# Patient Record
Sex: Male | Born: 1975 | Race: Black or African American | Hispanic: No | Marital: Married | Smoking: Never smoker
Health system: Southern US, Community
[De-identification: ages and names within clinical notes are randomized; demographics above are authoritative.]

## PROBLEM LIST (undated history)

## (undated) DIAGNOSIS — E119 Type 2 diabetes mellitus without complications: Secondary | ICD-10-CM

## (undated) DIAGNOSIS — G459 Transient cerebral ischemic attack, unspecified: Secondary | ICD-10-CM

## (undated) DIAGNOSIS — I10 Essential (primary) hypertension: Secondary | ICD-10-CM

## (undated) HISTORY — PX: FOOT SURGERY: SHX648

## (undated) HISTORY — PX: BRAIN SURGERY: SHX531

---

## 1999-11-01 ENCOUNTER — Emergency Department (HOSPITAL_COMMUNITY): Admission: EM | Admit: 1999-11-01 | Discharge: 1999-11-01 | Payer: Self-pay | Admitting: Emergency Medicine

## 2009-08-10 ENCOUNTER — Encounter: Admission: RE | Admit: 2009-08-10 | Discharge: 2009-08-10 | Payer: Self-pay | Admitting: Neurosurgery

## 2015-01-20 ENCOUNTER — Ambulatory Visit (INDEPENDENT_AMBULATORY_CARE_PROVIDER_SITE_OTHER): Payer: Commercial Managed Care - PPO | Admitting: Podiatry

## 2015-01-20 ENCOUNTER — Encounter: Payer: Self-pay | Admitting: Podiatry

## 2015-01-20 VITALS — BP 128/92 | HR 112 | Ht 74.0 in | Wt 257.0 lb

## 2015-01-20 DIAGNOSIS — M21969 Unspecified acquired deformity of unspecified lower leg: Secondary | ICD-10-CM | POA: Diagnosis not present

## 2015-01-20 DIAGNOSIS — E1140 Type 2 diabetes mellitus with diabetic neuropathy, unspecified: Secondary | ICD-10-CM | POA: Insufficient documentation

## 2015-01-20 DIAGNOSIS — M216X9 Other acquired deformities of unspecified foot: Secondary | ICD-10-CM | POA: Diagnosis not present

## 2015-01-20 DIAGNOSIS — E0842 Diabetes mellitus due to underlying condition with diabetic polyneuropathy: Secondary | ICD-10-CM | POA: Diagnosis not present

## 2015-01-20 NOTE — Patient Instructions (Signed)
Seen for numbness and tingling on both feet. Also noted of tight Achilles tendon and weakened first metatarsal bone. Need to maintain normal blood sugar. May benefit from Neuremedy. Need to do daily stretch exercise for tight Achilles tendon. Need custom orthotics.

## 2015-01-20 NOTE — Progress Notes (Signed)
Subjective: 39 year old male presents complaining of tingling, numbness, and throbbing sensations on both feet more at night started last June or July 2015 and is getting worse. They were worse when he tries to go to bed at night.  Been Diabetic x 3 years. Blood sugar is low now, 150 this morning. Usually runs 180-200. Taking Neurontin since last Friday and already can tell helping.  Sits at work. Up on feet about 3 hours/day. Also has noted of black spot on both great toe nails.  His feet are uncomfortable and aching to be on feet for exercise.   Review of Systems - General ROS: negative for - chills, fatigue, fever, malaise or night sweats Ophthalmic ROS: negative ENT ROS: negative Allergy and Immunology ROS: negative Hematological and Lymphatic ROS: negative Endocrine ROS: negative Respiratory ROS: no cough, shortness of breath, or wheezing Cardiovascular ROS: no chest pain or dyspnea on exertion Gastrointestinal ROS: no abdominal pain, change in bowel habits, or black or bloody stools Genito-Urinary ROS: no dysuria, trouble voiding, or hematuria Musculoskeletal ROS: negative Neurological ROS: no TIA or stroke symptoms Dermatological ROS: negative.  Objective: Vascular: All pedal pulses are palpable on both feet. Dermatologic: Normal skin. Small spot of dark discolored nail plate both great toe.  Neurologic: All epicritic and tactile sensations are grossly intact. Normal response to Monofilament sensory testing and vibratory sensory testing.  Orthopedic: Positive for tight Achilles tendon bilateral, forefoot varus, and weakened first metatarsal bone.   Assessment: Diabetic neuropathy. Ankle equinus bilateral. Pes planus bilateral. Forefoot varus with elevated first ray bilateral. STJ hyperpronation bilateral.  Plan: Need to maintain normal blood sugar, discussed long term effect of high blood sugar on nerves and small vessels. Continue with Neurontin.  May benefit from  Neuremedy. Need to do daily stretch exercise for tight Achilles tendon. May benefit from custom orthotics so that he can increase exercise activity.

## 2015-01-26 ENCOUNTER — Ambulatory Visit (INDEPENDENT_AMBULATORY_CARE_PROVIDER_SITE_OTHER): Payer: 59 | Admitting: Podiatry

## 2015-01-26 ENCOUNTER — Encounter: Payer: Self-pay | Admitting: Podiatry

## 2015-01-26 DIAGNOSIS — M21969 Unspecified acquired deformity of unspecified lower leg: Secondary | ICD-10-CM | POA: Diagnosis not present

## 2015-01-26 DIAGNOSIS — M216X9 Other acquired deformities of unspecified foot: Secondary | ICD-10-CM

## 2015-01-26 NOTE — Progress Notes (Signed)
Subjective: 39 year old male presents accompanied by his wife to have orthotics prepared.  Stated that he was doing stretch exercise, taking Neuremedy.  He feels the toes are less stiff and able to wiggle and move better today.   Objective: Vascular: All pedal pulses are palpable on both feet. Dermatologic: Normal skin. Small spot of dark discolored nail plate both great toe.  Neurologic: All epicritic and tactile sensations are grossly intact. Normal response to Monofilament sensory testing and vibratory sensory testing.  Orthopedic: Positive for tight Achilles tendon bilateral, forefoot varus, and weakened first metatarsal bone.  Radiographic examination reveal increased lateral deviation angle of Calcaneocuboid lateral deviation angles bilateral, Short first metatarsal bone bilateral, and presence of plantar calcaneal spur bilateral. Normal CYMA line.   Assessment: Diabetic neuropathy. Ankle equinus bilateral. Pes planus bilateral. Forefoot varus with elevated first ray bilateral. Short first Metatarsal bilateral. STJ hyperpronation bilateral.  Plan: Both feet casted for Orthotics. Continue with daily stretch exercise for tight Achilles tendon.

## 2015-03-22 ENCOUNTER — Ambulatory Visit (INDEPENDENT_AMBULATORY_CARE_PROVIDER_SITE_OTHER): Payer: Commercial Managed Care - PPO | Admitting: Podiatry

## 2015-03-22 ENCOUNTER — Encounter: Payer: Self-pay | Admitting: Podiatry

## 2015-03-22 DIAGNOSIS — M216X9 Other acquired deformities of unspecified foot: Secondary | ICD-10-CM

## 2015-03-22 NOTE — Patient Instructions (Signed)
Doing well with orthotics. Continue with stretch exercise. Return as needed.

## 2015-03-22 NOTE — Progress Notes (Signed)
Seen improvement with orthotics. Still some pain at times. Still much less then what they were.  Still tight on right Achilles tendon. Instructed to stretch. Return as needed.

## 2016-03-11 ENCOUNTER — Emergency Department (HOSPITAL_COMMUNITY)
Admission: EM | Admit: 2016-03-11 | Discharge: 2016-03-11 | Disposition: A | Discharge status: Home / Self Care | Payer: Commercial Managed Care - PPO | Attending: Emergency Medicine | Admitting: Emergency Medicine

## 2016-03-11 ENCOUNTER — Encounter (HOSPITAL_COMMUNITY): Payer: Self-pay

## 2016-03-11 DIAGNOSIS — E119 Type 2 diabetes mellitus without complications: Secondary | ICD-10-CM | POA: Insufficient documentation

## 2016-03-11 DIAGNOSIS — R2241 Localized swelling, mass and lump, right lower limb: Secondary | ICD-10-CM | POA: Diagnosis present

## 2016-03-11 DIAGNOSIS — Z7984 Long term (current) use of oral hypoglycemic drugs: Secondary | ICD-10-CM | POA: Diagnosis not present

## 2016-03-11 DIAGNOSIS — L03031 Cellulitis of right toe: Secondary | ICD-10-CM | POA: Insufficient documentation

## 2016-03-11 DIAGNOSIS — L600 Ingrowing nail: Secondary | ICD-10-CM | POA: Diagnosis not present

## 2016-03-11 DIAGNOSIS — Z79899 Other long term (current) drug therapy: Secondary | ICD-10-CM | POA: Insufficient documentation

## 2016-03-11 DIAGNOSIS — I10 Essential (primary) hypertension: Secondary | ICD-10-CM | POA: Diagnosis not present

## 2016-03-11 HISTORY — DX: Type 2 diabetes mellitus without complications: E11.9

## 2016-03-11 HISTORY — DX: Essential (primary) hypertension: I10

## 2016-03-11 MED ORDER — LIDOCAINE-EPINEPHRINE (PF) 2 %-1:200000 IJ SOLN
20.0000 mL | Freq: Once | INTRAMUSCULAR | Status: AC
  Administered 2016-03-11: 20 mL
  Filled 2016-03-11: qty 20

## 2016-03-11 MED ORDER — BACITRACIN ZINC 500 UNIT/GM EX OINT
TOPICAL_OINTMENT | CUTANEOUS | Status: AC
  Administered 2016-03-11: 1
  Filled 2016-03-11: qty 0.9

## 2016-03-11 NOTE — ED Provider Notes (Signed)
CSN: [id number]     Arrival date & time 03/11/16  1424 History   First MD Initiated Contact with Patient 03/11/16 1650     Chief Complaint  Patient presents with  . Foot Swelling      HPI Patient has a history of diabetes and presents with increasing irritation of his right great toe since Tuesday.  He has had some swelling and redness and was seen in urgent care yesterday and started clindamycin.  He returned for repeat evaluation today and they're concerned that he had some redness streaking up his right foot and right leg.  He has no fevers or chills.  No significant pain with range of motion of his right great toe.  No other complaints   Past Medical History  Diagnosis Date  . Diabetes mellitus without complication (HCC)   . Hypertension    Past Surgical History  Procedure Laterality Date  . Brain surgery     No family history on file. Social History  Substance Use Topics  . Smoking status: Never Smoker   . Smokeless tobacco: Never Used  . Alcohol Use: No    Review of Systems  All other systems reviewed and are negative.     Allergies  Review of patient's allergies indicates no known allergies.  Home Medications   Prior to Admission medications   Medication Sig Start Date End Date Taking? Authorizing Provider  acetaminophen (TYLENOL) 500 MG tablet Take 500 mg by mouth every 6 (six) hours as needed for mild pain, moderate pain, fever or headache.   Yes Historical Provider, MD  cefTRIAXone (ROCEPHIN) 1 g injection Inject 1 g into the muscle once.   Yes Historical Provider, MD  clindamycin (CLEOCIN) 300 MG capsule Take 300 mg by mouth every 6 (six) hours. Started 04/14 for 10 days   Yes Historical Provider, MD  glipiZIDE (GLUCOTROL XL) 10 MG 24 hr tablet Take 10 mg by mouth daily. Reported on 03/11/2016 01/18/15  Yes Historical Provider, MD  lisinopril (PRINIVIL,ZESTRIL) 40 MG tablet Take 40 mg by mouth daily. Reported on 03/11/2016 01/18/15  Yes Historical Provider, MD   metFORMIN (GLUCOPHAGE-XR) 500 MG 24 hr tablet Take 1,000 mg by mouth 2 (two) times daily.   Yes Historical Provider, MD   BP 131/80 mmHg  Pulse 96  Temp(Src) 98 F (36.7 C) (Oral)  Resp 18  SpO2 96% Physical Exam  Constitutional: He is oriented to person, place, and time. He appears well-developed and well-nourished.  HENT:  Head: Normocephalic.  Eyes: EOM are normal.  Neck: Normal range of motion.  Pulmonary/Chest: Effort normal.  Abdominal: He exhibits no distension.  Musculoskeletal: Normal range of motion.  Right great toe with obvious infected and inflamed ingrown toenail of the right great toe on the lateral aspect.  There is no significant spreading erythema onto the foot or significant warmth of the toe.  No pain with range of motion of the right great toe.  No significant fluctuance of the right great toe  Neurological: He is alert and oriented to person, place, and time.  Psychiatric: He has a normal mood and affect.  Nursing note and vitals reviewed.   ED Course  .Nail Removal Performed by: Azalia BilisAMPOS, Parlee Amescua Authorized by: Azalia BilisAMPOS, Ario Mcdiarmid Risks and benefits: risks, benefits and alternatives were discussed Consent given by: patient Required items: required blood products, implants, devices, and special equipment available Patient identity confirmed: verbally with patient Time out: Immediately prior to procedure a "time out" was called to verify the correct  patient, procedure, equipment, support staff and site/side marked as required. Location: right foot Location details: right big toe Anesthesia: digital block Preparation: skin prepped with ChloraPrep Amount removed: 1/5 Nail removed location: lateral portion right great toe. Removed nail replaced and anchored: no Dressing: antibiotic ointment Patient tolerance: Patient tolerated the procedure well with no immediate complications   (including critical care time) Labs Review Labs Reviewed - No data to  display  Imaging Review No results found. I have personally reviewed and evaluated these images and lab results as part of my medical decision-making.   EKG Interpretation None      MDM   Final diagnoses:  Ingrown right greater toenail  Cellulitis of toe, right    Patient with ingrown right great toe.  Overall he is well appearing.  He has not had great healing to this point can see needed excision of his nail.  I've been able to remove successfully the lateral 1/5 of his nail with improvement in his symptoms.  He's been instructed in twice a day antibacterial warm water soaks.  Topical antibiotic ointment.  Ongoing treatment with clindamycin 4 times a day 10 days as he was prescribed.  He'll keep a close eye on his toe.  I do not think he needs IV antibiotics at this time.  I do not think he needs admission to the hospital or x-ray.  Doubt osteomyelitis.  Patient understands to return to the ER for new or worsening symptoms    Azalia Bilis, MD 03/11/16 2014

## 2016-03-11 NOTE — ED Notes (Signed)
Pt reports understanding of discharge information. No questions at time of discharge 

## 2016-03-11 NOTE — ED Notes (Signed)
Pt presents with c/o foot swelling. Pt is a diabetic, has had elevated blood sugars recently. Pt pulled off a hangnail on Tuesday and since then has had some foot swelling and increase redness traveling up his leg. Marked by UC yesterday and the redness has since moved over the markings per pt.

## 2018-06-04 ENCOUNTER — Ambulatory Visit (INDEPENDENT_AMBULATORY_CARE_PROVIDER_SITE_OTHER): Payer: Commercial Managed Care - PPO | Admitting: Podiatry

## 2018-06-04 DIAGNOSIS — M21962 Unspecified acquired deformity of left lower leg: Secondary | ICD-10-CM

## 2018-06-04 DIAGNOSIS — M216X2 Other acquired deformities of left foot: Secondary | ICD-10-CM

## 2018-06-04 DIAGNOSIS — E0842 Diabetes mellitus due to underlying condition with diabetic polyneuropathy: Secondary | ICD-10-CM | POA: Diagnosis not present

## 2018-06-04 DIAGNOSIS — L600 Ingrowing nail: Secondary | ICD-10-CM

## 2018-06-04 NOTE — Patient Instructions (Signed)
Ingrown nail surgery was done on left medial border. Follow soaking instruction.  Some redness and drainage is expected. Call the office if the area gets feverish with increased redness and drainage. Return in one week.

## 2018-06-04 NOTE — Progress Notes (Signed)
Subjective: 42 y.o. year old male patient presents complaining of recurring ingrown nail problem on both great toes at medial border. Has had uncontrolled blood sugar for more than a year. Been followed by endocrinologist and blood sugar is under control. Having swelling problem on left lower limb for duration of 2-3 months. 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 Feet are hurting to do more.  Objective: Dermatologic: Ingrown nail both great toe medial borders with dry blood in nail groove. Vascular: Pedal pulses are all palpable. Orthopedic: Hypermobile first ray with excess STJ pronation left>right. Neurologic: Loss of protective sensory perception on both feet.  Assessment: Ingrown hallucal nail both great toe with recurring infection. Diabetic neuropathy. Early onset of Charcot joint left.  Treatment: Reviewed findings and available treatment options. May benefit from proper shoe gear and custom orthotics. As per request left great toe ingrown nail surgery done on medial border.  Affected left great toe was anesthetized with total 5ml mixture of 50/50 0.5% Marcaine plain and 1% Xylocaine plain. Affected medial nail border was reflected with a nail elevator and excised with nail nipper. Proximal nail matrix tissue was cauterized with Phenol soaked cotton applicator x 4 and neutralized with Alcohol soaked cotton applicator. The wound was dressed with Amerigel ointment dressing. Home care instructions and supply dispensed.  Return in 1 week for follow up.

## 2018-06-06 ENCOUNTER — Encounter: Payer: Self-pay | Admitting: Podiatry

## 2018-06-06 DIAGNOSIS — L600 Ingrowing nail: Secondary | ICD-10-CM | POA: Insufficient documentation

## 2018-06-12 ENCOUNTER — Encounter: Payer: Commercial Managed Care - PPO | Admitting: Podiatry

## 2018-06-13 ENCOUNTER — Telehealth: Payer: Self-pay | Admitting: *Deleted

## 2018-06-13 NOTE — Telephone Encounter (Signed)
For CPT code L3020 patient has a $750 deductible in which $593.73 has been met. After deductible is satisfied plan covers 90% of allowed amount. Max out of pocket is 2250. Patient is coming in tomorrow

## 2018-06-14 ENCOUNTER — Ambulatory Visit (INDEPENDENT_AMBULATORY_CARE_PROVIDER_SITE_OTHER): Payer: Commercial Managed Care - PPO | Admitting: Podiatry

## 2018-06-14 ENCOUNTER — Encounter: Payer: Self-pay | Admitting: Podiatry

## 2018-06-14 VITALS — Temp 97.8°F

## 2018-06-14 DIAGNOSIS — L600 Ingrowing nail: Secondary | ICD-10-CM | POA: Diagnosis not present

## 2018-06-14 DIAGNOSIS — E0842 Diabetes mellitus due to underlying condition with diabetic polyneuropathy: Secondary | ICD-10-CM

## 2018-06-14 NOTE — Patient Instructions (Signed)
Ingrown nail surgery was done on right great toe both borders.. Follow soaking instruction.  Some redness and drainage is expected. Call the office if the area gets feverish with increased redness and drainage. Return in one week.  

## 2018-06-14 NOTE — Progress Notes (Signed)
Subjective: 42 y.o. year old male patient presents for follow up on left great toe and nail surgery on right great toe. History of ingrown nail surgery (06/04/18) left great toe medial border healed well without complication. Patient will wait on orthotics a few more weeks. Today patient and his wife request right great toe nail surgery done to stop ingrown nail problem.  Objective: Dermatologic: Well healed left great toe medial border following matrixectomy. Symptomatic ingrown hallucal nail right great toe both broders. Vascular: Pedal pulses are all palpable. Orthopedic: No gross deformities. Neurologic: Diabetic Peripheral neuropathy.  Assessment: Well healed post surgical nail left great toe medial border. Ingrown hallucal nail right great toe both borders.  Treatment: As per request, ingrown nail surgery done on right great toe both borders. Affected right great toe was anesthetized with total 5ml mixture of 50/50 0.5% Marcaine plain and 1% Xylocaine plain. Affected medial and lateral nail borders were reflected with a nail elevator and excised with nail nipper. Proximal nail matrix tissue was cauterized with Phenol soaked cotton applicator x 4 and neutralized with Alcohol soaked cotton applicator. The wound was dressed with Amerigel ointment dressing. Home care instructions and supply dispensed.  Return in 1 week for follow up.

## 2018-06-20 ENCOUNTER — Ambulatory Visit (INDEPENDENT_AMBULATORY_CARE_PROVIDER_SITE_OTHER): Payer: Commercial Managed Care - PPO | Admitting: Podiatry

## 2018-06-20 DIAGNOSIS — M21961 Unspecified acquired deformity of right lower leg: Secondary | ICD-10-CM | POA: Diagnosis not present

## 2018-06-20 DIAGNOSIS — M216X2 Other acquired deformities of left foot: Secondary | ICD-10-CM

## 2018-06-20 DIAGNOSIS — E0842 Diabetes mellitus due to underlying condition with diabetic polyneuropathy: Secondary | ICD-10-CM | POA: Diagnosis not present

## 2018-06-20 DIAGNOSIS — M14672 Charcot's joint, left ankle and foot: Secondary | ICD-10-CM

## 2018-06-20 NOTE — Patient Instructions (Signed)
Follow up on bilateral ingrown nail surgery both great toes. Doing well on both toes. Both feet casted for Orthotics.

## 2018-06-20 NOTE — Progress Notes (Signed)
Subjective: [age] year ols male presents for follow up on bilateral ingrown nail surgery and to prepare for orthotics. Having red swollen right forefoot started today. This happened to the left foot the other day. The redness went away. Yesterday blood sugar was 140. He has had unreadable blood sugar for over 2 years till recently.   HPI: Been followed by endocrinologist and blood sugar is under control. Having swelling problem on left lower limb for duration of 2-3 months. 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 Feet are hurting to do more.  Objective: Dermatologic: Ingrown nail surgical site healed well. Vascular: Pedal pulses are all palpable. Orthopedic: Hypermobile first ray with excess STJ pronation left>right. Neurologic: Loss of protective sensory perception on both feet. Radiographic examination reveal short first metatarsal bone, Fibular sesamoid position at 4 bilateral in AP view. Pronated foot with elevated first ray, midtarsal sagging, and plantar calcaneal spur left. Normal on right.  Assessment: Ingrown hallucal nail surgery healed well. Diabetic neuropathy. Early onset of Charcot joint left.  Plan: Both feet casted for Orthotics. Keep the nails covered during the day and leave them open at night. Continue to soak till the tenderness stops.

## 2018-06-21 ENCOUNTER — Encounter: Payer: Self-pay | Admitting: Podiatry

## 2019-05-29 ENCOUNTER — Encounter (HOSPITAL_BASED_OUTPATIENT_CLINIC_OR_DEPARTMENT_OTHER): Payer: Self-pay | Admitting: Emergency Medicine

## 2019-05-29 ENCOUNTER — Emergency Department (HOSPITAL_BASED_OUTPATIENT_CLINIC_OR_DEPARTMENT_OTHER): Payer: Commercial Managed Care - PPO

## 2019-05-29 ENCOUNTER — Other Ambulatory Visit: Payer: Self-pay

## 2019-05-29 ENCOUNTER — Emergency Department (HOSPITAL_BASED_OUTPATIENT_CLINIC_OR_DEPARTMENT_OTHER)
Admission: EM | Admit: 2019-05-29 | Discharge: 2019-05-29 | Disposition: A | Discharge status: Home / Self Care | Payer: Commercial Managed Care - PPO | Attending: Emergency Medicine | Admitting: Emergency Medicine

## 2019-05-29 DIAGNOSIS — X509XXA Other and unspecified overexertion or strenuous movements or postures, initial encounter: Secondary | ICD-10-CM | POA: Insufficient documentation

## 2019-05-29 DIAGNOSIS — R2242 Localized swelling, mass and lump, left lower limb: Secondary | ICD-10-CM | POA: Insufficient documentation

## 2019-05-29 DIAGNOSIS — I10 Essential (primary) hypertension: Secondary | ICD-10-CM | POA: Insufficient documentation

## 2019-05-29 DIAGNOSIS — Y93B9 Activity, other involving muscle strengthening exercises: Secondary | ICD-10-CM | POA: Insufficient documentation

## 2019-05-29 DIAGNOSIS — E119 Type 2 diabetes mellitus without complications: Secondary | ICD-10-CM | POA: Diagnosis not present

## 2019-05-29 DIAGNOSIS — S99922A Unspecified injury of left foot, initial encounter: Secondary | ICD-10-CM | POA: Diagnosis present

## 2019-05-29 DIAGNOSIS — Y9239 Other specified sports and athletic area as the place of occurrence of the external cause: Secondary | ICD-10-CM | POA: Diagnosis not present

## 2019-05-29 DIAGNOSIS — Z79899 Other long term (current) drug therapy: Secondary | ICD-10-CM | POA: Diagnosis not present

## 2019-05-29 DIAGNOSIS — R52 Pain, unspecified: Secondary | ICD-10-CM

## 2019-05-29 DIAGNOSIS — Y999 Unspecified external cause status: Secondary | ICD-10-CM | POA: Diagnosis not present

## 2019-05-29 DIAGNOSIS — S93325A Dislocation of tarsometatarsal joint of left foot, initial encounter: Secondary | ICD-10-CM | POA: Diagnosis not present

## 2019-05-29 DIAGNOSIS — Z7984 Long term (current) use of oral hypoglycemic drugs: Secondary | ICD-10-CM | POA: Insufficient documentation

## 2019-05-29 HISTORY — DX: Transient cerebral ischemic attack, unspecified: G45.9

## 2019-05-29 MED ORDER — HYDROCODONE-ACETAMINOPHEN 5-325 MG PO TABS
2.0000 | ORAL_TABLET | Freq: Once | ORAL | Status: AC
Start: 2019-05-29 — End: 2019-05-29
  Administered 2019-05-29: 2 via ORAL
  Filled 2019-05-29: qty 2

## 2019-05-29 MED ORDER — HYDROCODONE-ACETAMINOPHEN 5-325 MG PO TABS: 1.0000 | ORAL_TABLET | ORAL | 0 refills | Status: AC | PRN

## 2019-05-29 NOTE — ED Notes (Signed)
Patient transported to Ultrasound 

## 2019-05-29 NOTE — ED Notes (Signed)
Pt. Just returned from Radiology 

## 2019-05-29 NOTE — ED Notes (Signed)
ED Provider at bedside. 

## 2019-05-29 NOTE — Discharge Instructions (Addendum)
Follow up with Dr. Doran Durand next week.   Do NOT put weight on your left foot. Keep it elevated.

## 2019-05-29 NOTE — ED Triage Notes (Signed)
Pt states that last night he was at the gym doing leg presses. He continued to do toe presses when his left shoe flew off and there was a pop. Pt states he has DM and neuropathy. He states he deals with swelling but it is worse today. Left leg is swollen from knee down. Hurts when he bears weight.

## 2019-05-29 NOTE — ED Provider Notes (Signed)
Medical screening examination/treatment/procedure(s) were conducted as a shared visit with non-physician practitioner(s) and myself.  I personally evaluated the patient during the encounter.      X-ray shows Lisfranc fracture dislocation.  Discussed with Dr. Lyla Glassing, asked for posterior splint, nonweightbearing, and follow-up with Dr. Doran Durand next week.  As for CT foot which will be ordered.  Patient will be given Norco for pain here and at discharge.   Sherwood Gambler, MD 05/29/19 [phone]

## 2019-05-29 NOTE — ED Notes (Signed)
Patient transported to CT 

## 2019-05-29 NOTE — ED Provider Notes (Signed)
[facility] EMERGENCY DEPARTMENT Provider Note   CSN: [id number] Arrival date & time: 05/29/19  2003    History   Chief Complaint Chief Complaint  Patient presents with   Foot Pain   Leg Swelling    HPI Justin Watkins is a 43 y.o. male who presents for evaluation of pain, swelling noted medial aspect of his left foot that is been ongoing since yesterday.  Reports that yesterday, he was doing a toe raise at the gym and states that during part of it, his shoe fell off, causing his foot to turn abnormal.  He states that initially, he did not have any pain associated with it.  He states that as he got up and walked around the gym, he started noticing some mild pain to the foot.  Additionally, he felt like his foot was caving in when he walked.  He states he is also had some swelling noted to the medial aspect of the foot.  Patient states that he has been able to bear weight but does report some worsening pain with doing so.  He states that the pain is a 5/10.  Patient states that he always has edema noted to his left lower extremity which is has been an ongoing issue for several years.  He has been prescribed Lasix which he states he has been taken.  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  He is not currently on blood thinners.  He denies any recent surgeries, hospitalizations.  He denies any fevers, numbness/weakness.     The history is provided by the patient.    Past Medical History:  Diagnosis Date   Diabetes mellitus without complication (HCC)    Hypertension    TIA (transient ischemic attack)     Patient Active Problem List   Diagnosis Date Noted   Ingrown nail of great toe of left foot 06/06/2018   Diabetic neuropathy (HCC) 01/20/2015   Metatarsal deformity 01/20/2015   Equinus deformity of foot, acquired 01/20/2015   Pronation deformity of ankle, acquired 01/20/2015    Past Surgical History:    Procedure Laterality Date   BRAIN SURGERY          Home Medications    Prior to Admission medications   Medication Sig Start Date End Date Taking? Authorizing Provider  acetaminophen (TYLENOL) 500 MG tablet Take 500 mg by mouth every 6 (six) hours as needed for mild pain, moderate pain, fever or headache.    [provider]  cefTRIAXone (ROCEPHIN) 1 g injection Inject 1 g into the muscle once.    [provider]  clindamycin (CLEOCIN) 300 MG capsule Take 300 mg by mouth every 6 (six) hours. Started 04/14 for 10 days    [provider]  glipiZIDE (GLUCOTROL XL) 10 MG 24 hr tablet Take 10 mg by mouth daily. Reported on 03/11/2016 01/18/15   [provider]  HYDROcodone-acetaminophen (NORCO) 5-325 MG tablet Take 1 tablet by mouth every 4 (four) hours as needed for up to 3 days. 05/29/19 06/01/19  Pricilla LovelessGoldston, Scott, MD  lisinopril (PRINIVIL,ZESTRIL) 40 MG tablet Take 40 mg by mouth daily. Reported on 03/11/2016 01/18/15   [provider]  metFORMIN (GLUCOPHAGE-XR) 500 MG 24 hr tablet Take 1,000 mg by mouth 2 (two) times daily.    [provider]    Family History No family history on file.  Social History Social History   Tobacco Use  Smoking status: Never Smoker   Smokeless tobacco: Never Used  Substance Use Topics   Alcohol use: No    Alcohol/week: 0.0 standard drinks   Drug use: No     Allergies   Patient has no known allergies.   Review of Systems Review of Systems  Cardiovascular: Positive for leg swelling (Chronic).  Musculoskeletal:       Left foot pain  Skin: Negative for color change.  Neurological: Negative for weakness and numbness.  All other systems reviewed and are negative.    Physical Exam Updated Vital Signs BP 117/68    Pulse (!) 108    Temp 98.8 F (37.1 C)    Resp 18    Ht 6\' 1"  (1.854 m)    Wt 129.3 kg    SpO2 97%    BMI 37.60 kg/m   Physical Exam Vitals signs and nursing note reviewed.   Constitutional:      Appearance: He is well-developed.  HENT:     Head: Normocephalic and atraumatic.  Eyes:     General: No scleral icterus.       Right eye: No discharge.        Left eye: No discharge.     Conjunctiva/sclera: Conjunctivae normal.  Cardiovascular:     Pulses:          Dorsalis pedis pulses are 2+ on the right side and 2+ on the left side.  Pulmonary:     Effort: Pulmonary effort is normal.  Musculoskeletal:     Comments: Edema noted to left lower extremity.  Left lower extremity slightly larger than right lower extremity.  Mild tenderness palpation noted to left calf.  No overlying warmth, erythema.  Tenderness palpation noted medial aspect of left foot that extends over to the plantar surface.  No overlying warmth, erythema.  Dorsiflexion and plantar flexion intact without difficulty.  No deficits with palpation of the Achilles tendon.  He can move all 5 toes without any difficulty.  Skin:    General: Skin is warm and dry.     Capillary Refill: Capillary refill takes less than 2 seconds.     Comments: Good distal cap refill. LLE is not dusky in appearance or cool to touch.  Neurological:     Mental Status: He is alert.     Comments: Sensation intact along major nerve distributions of BLE  Psychiatric:        Speech: Speech normal.        Behavior: Behavior normal.      ED Treatments / Results  Labs (all labs ordered are listed, but only abnormal results are displayed) Labs Reviewed - No data to display  EKG None  Radiology Ct Foot Left Wo Contrast  Result Date: 05/29/2019 CLINICAL DATA:  43 year old male with foot pain and evidence of Lisfranc fracture dislocation on radiographs today. EXAM: CT OF THE LEFT FOOT WITHOUT CONTRAST TECHNIQUE: Multidetector CT imaging of the left foot was performed according to the standard protocol. Multiplanar CT image reconstructions were also generated. COMPARISON:  Left foot series earlier today. FINDINGS: Positive for  Lisfranc fracture dislocation involving all 5 TMT joints with homolateral displacement. Associated multiple small articular and periarticular avulsion fragments throughout the TMT joints (series 7). None of these fractures appear to propagate distally in the affected metatarsals. And likewise the underlying cuneiforms and cuboid remain otherwise intact. The navicular, talus and calcaneus are intact. The MTP joints are intact and normally aligned. There are chronic appearing fractures of the 4th  and 5th proximal phalanges. No acute phalanx fracture identified. Preserved ankle joint alignment. Distal tibia and fibula appear intact. Generalized soft tissue edema throughout the left foot and ankle. No soft tissue gas identified. IMPRESSION: Confirmed Homolateral Lisfranc Fracture Dislocation of the left foot affecting the 1st through 5th TMT joints. Electronically Signed   By: H  Hall M.D.   On: 05/29/2019 23:33   [place] VeOdessa Flemingnous Img Lower  Left (dvt Study)  Result Date: 05/29/2019 CLINICAL DATA:  Leg swelling. EXAM: LEFT LOWER EXTREMITY VENOUS DOPPLER ULTRASOUND TECHNIQUE: Gray-scale sonography with graded compression, as well as color Doppler and duplex ultrasound were performed to evaluate the lower extremity deep venous systems from the level of the common femoral vein and including the common femoral, femoral, profunda femoral, popliteal and calf veins including the posterior tibial, peroneal and gastrocnemius veins when visible. The superficial great saphenous vein was also interrogated. Spectral Doppler was utilized to evaluate flow at rest and with distal augmentation maneuvers in the common femoral, femoral and popliteal veins. COMPARISON:  None. FINDINGS: Contralateral Common Femoral Vein: Respiratory phasicity is normal and symmetric with the symptomatic side. No evidence of thrombus. Normal compressibility. Common Femoral Vein: No evidence of thrombus. Normal compressibility, respiratory phasicity and response  to augmentation. Saphenofemoral Junction: No evidence of thrombus. Normal compressibility and flow on color Doppler imaging. Profunda Femoral Vein: No evidence of thrombus. Normal compressibility and flow on color Doppler imaging. Femoral Vein: No evidence of thrombus. Normal compressibility, respiratory phasicity and response to augmentation. Popliteal Vein: No evidence of thrombus. Normal compressibility, respiratory phasicity and response to augmentation. Calf Veins: No evidence of thrombus. Normal compressibility and flow on color Doppler imaging. The peroneal vein was not well visualized on this exam. Superficial Great Saphenous Vein: No evidence of thrombus. Normal compressibility. Venous Reflux:  None. Other Findings:  None. IMPRESSION: No evidence of deep venous thrombosis. Electronically Signed   By: Katherine Mantlehristopher  Green M.D.   On: 05/29/2019 21:37   Dg Foot Complete Left  Result Date: 05/29/2019 CLINICAL DATA:  Felt pop.  Foot pain EXAM: LEFT FOOT - COMPLETE 3+ VIEW COMPARISON:  None. FINDINGS: Fracture and subluxation noted at the base of the 2nd metatarsal and likely 3rd metatarsal compatible with Lisfranc injury. No additional fracture seen. Joint spaces maintained. IMPRESSION: Lisfranc injury. Fracture and subluxation noted at the base of the 2nd and likely 3rd metatarsals. Electronically Signed   By: Charlett NoseKevin  Dover M.D.   On: 05/29/2019 22:00   Dg Foot Complete Right  Result Date: 05/29/2019 CLINICAL DATA:  Right foot pain EXAM: RIGHT FOOT COMPLETE - 3+ VIEW COMPARISON:  None. FINDINGS: There is no evidence of fracture or dislocation. There is no evidence of arthropathy or other focal bone abnormality. Soft tissues are unremarkable. IMPRESSION: Negative. Electronically Signed   By: Charlett NoseKevin  Dover M.D.   On: 05/29/2019 22:01    Procedures Procedures (including critical care time)  Medications Ordered in ED Medications  HYDROcodone-acetaminophen (NORCO/VICODIN) 5-325 MG per tablet 2 tablet (2  tablets Oral Given 05/29/19 2240)     Initial Impression / Assessment and Plan / ED Course  I have reviewed the triage vital signs and the nursing notes.  Pertinent labs & imaging results that were available during my care of the patient were reviewed by me and considered in my medical decision making (see chart for details).        43 year old male who presents for evaluation of left foot pain.  Was at the gym when his shoe came off  and he thinks the weight hit his foot and abnormal way.  Since then reports pain with walking.  He has left lower extremity edema which he states is not new.  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  No fevers, numbness/weakness. Patient is afebrile, non-toxic appearing, sitting comfortably on examination table. Vital signs reviewed and stable. Patient is neurovascularly intact.  Consider fracture versus dislocation.  History/physical exam not concerning for infectious etiology.  Additionally, history/physical exam not concerning for Achilles tendon rupture.  He does have significant edema of the left lower extremity.  He does state that this is old but it is significant the more so than the right.  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  Will plan for ultrasound for evaluation of DVT as well as x-rays.  U/S negative for any acute DVT.  Patient signed out to Dr. Criss AlvineGoldston with XR prending.   Portions of this note were generated with Scientist, clinical (histocompatibility and immunogenetics)Dragon dictation software. Dictation errors may occur despite best attempts at proofreading.  Final Clinical Impressions(s) / ED Diagnoses   Final diagnoses:  Lisfranc dislocation, left, initial encounter    ED Discharge Orders         Ordered    HYDROcodone-acetaminophen (NORCO) 5-325 MG tablet  Every 4 hours PRN     05/29/19 2313           Maxwell CaulLayden, Kahley Leib A, PA-C 05/29/19 2345    Pricilla LovelessGoldston, Scott, MD 06/02/19 1352

## 2020-05-10 ENCOUNTER — Other Ambulatory Visit: Payer: Self-pay

## 2020-05-10 ENCOUNTER — Emergency Department (HOSPITAL_BASED_OUTPATIENT_CLINIC_OR_DEPARTMENT_OTHER)
Admission: EM | Admit: 2020-05-10 | Discharge: 2020-05-10 | Disposition: A | Discharge status: Home / Self Care | Payer: Commercial Managed Care - PPO | Attending: Emergency Medicine | Admitting: Emergency Medicine

## 2020-05-10 ENCOUNTER — Encounter (HOSPITAL_BASED_OUTPATIENT_CLINIC_OR_DEPARTMENT_OTHER): Payer: Self-pay | Admitting: Emergency Medicine

## 2020-05-10 DIAGNOSIS — I10 Essential (primary) hypertension: Secondary | ICD-10-CM | POA: Diagnosis not present

## 2020-05-10 DIAGNOSIS — Z8673 Personal history of transient ischemic attack (TIA), and cerebral infarction without residual deficits: Secondary | ICD-10-CM | POA: Insufficient documentation

## 2020-05-10 DIAGNOSIS — Y999 Unspecified external cause status: Secondary | ICD-10-CM | POA: Diagnosis not present

## 2020-05-10 DIAGNOSIS — Y9239 Other specified sports and athletic area as the place of occurrence of the external cause: Secondary | ICD-10-CM | POA: Diagnosis not present

## 2020-05-10 DIAGNOSIS — S29011A Strain of muscle and tendon of front wall of thorax, initial encounter: Secondary | ICD-10-CM | POA: Insufficient documentation

## 2020-05-10 DIAGNOSIS — S4991XA Unspecified injury of right shoulder and upper arm, initial encounter: Secondary | ICD-10-CM | POA: Diagnosis present

## 2020-05-10 DIAGNOSIS — Z7901 Long term (current) use of anticoagulants: Secondary | ICD-10-CM | POA: Diagnosis not present

## 2020-05-10 DIAGNOSIS — E1140 Type 2 diabetes mellitus with diabetic neuropathy, unspecified: Secondary | ICD-10-CM | POA: Diagnosis not present

## 2020-05-10 DIAGNOSIS — X500XXA Overexertion from strenuous movement or load, initial encounter: Secondary | ICD-10-CM | POA: Insufficient documentation

## 2020-05-10 DIAGNOSIS — Y9343 Activity, gymnastics: Secondary | ICD-10-CM | POA: Insufficient documentation

## 2020-05-10 NOTE — ED Notes (Signed)
EDP at bedside  

## 2020-05-10 NOTE — ED Provider Notes (Signed)
[facility] EMERGENCY DEPARTMENT Provider Note   CSN: [id number] Arrival date & time: 05/10/20  0802     History Chief Complaint  Patient presents with  . Shoulder Injury    Justin Watkins is a 44 y.o. male.  HPI   44 year old male with right shoulder pain.  He was bench pressing yesterday when he felt a pop and had pain in his right shoulder.  Persistent pain since.  This morning he noticed a large bruise in the proximal/medial right arm.  Minimal symptoms at rest.   Past Medical History:  Diagnosis Date  . Diabetes mellitus without complication (HCC)   . Hypertension   . TIA (transient ischemic attack)     Patient Active Problem List   Diagnosis Date Noted  . Ingrown nail of great toe of left foot 06/06/2018  . Diabetic neuropathy (HCC) 01/20/2015  . Metatarsal deformity 01/20/2015  . Equinus deformity of foot, acquired 01/20/2015  . Pronation deformity of ankle, acquired 01/20/2015    Past Surgical History:  Procedure Laterality Date  . BRAIN SURGERY    . FOOT SURGERY         No family history on file.  Social History   Tobacco Use  . Smoking status: Never Smoker  . Smokeless tobacco: Never Used  Substance Use Topics  . Alcohol use: No    Alcohol/week: 0.0 standard drinks  . Drug use: No    Home Medications Prior to Admission medications   Medication Sig Start Date End Date Taking? Authorizing Provider  B-D UF III MINI PEN NEEDLES 31G X 5 MM MISC USE TO CHECK SUGAR 4 TIMES DAILY 03/20/20   [provider]  furosemide (LASIX) 20 MG tablet Take 40 mg by mouth daily. 05/04/20   [provider]  HUMALOG KWIKPEN 100 UNIT/ML KwikPen TAKE 15 25 UNITS TWICE BEFORE MEALS (MDD 50 UNITS) 05/04/20   [provider]  LANTUS SOLOSTAR 100 UNIT/ML Solostar Pen SMARTSIG:50 Unit(s) SUB-Q Daily 05/04/20   [provider]  losartan (COZAAR) 100 MG tablet Take 100 mg by mouth daily. 03/08/20   [provider]  pioglitazone  (ACTOS) 15 MG tablet Take 15 mg by mouth daily. 02/26/20   [provider]  rosuvastatin (CRESTOR) 10 MG tablet Take 10 mg by mouth daily. 03/05/20   [provider]  testosterone cypionate (DEPOTESTOSTERONE CYPIONATE) 200 MG/ML injection Inject 200 mg into the muscle every 14 (fourteen) days. 02/04/20   [provider]  Vitamin D, Ergocalciferol, (DRISDOL) 1.25 MG (50000 UNIT) CAPS capsule Take 50,000 Units by mouth once a week. 03/17/20   [provider]    Allergies    Doxycycline  Review of Systems   Review of Systems All systems reviewed and negative, other than as noted in HPI.  Physical Exam Updated Vital Signs BP (!) 161/105 (BP Location: Left Arm)   Pulse 96   Temp 98.3 F (36.8 C) (Oral)   Resp 16   Ht 6\' 1"  (1.854 m)   Wt (!) 138.6 kg   SpO2 96%   BMI 40.31 kg/m   Physical Exam Vitals and nursing note reviewed.  Constitutional:      General: He is not in acute distress.    Appearance: He is well-developed.  HENT:     Head: Normocephalic and atraumatic.  Eyes:     General:        Right eye: No discharge.        Left eye: No discharge.  Conjunctiva/sclera: Conjunctivae normal.  Cardiovascular:     Rate and Rhythm: Normal rate and regular rhythm.     Heart sounds: Normal heart sounds. No murmur heard.  No friction rub. No gallop.   Pulmonary:     Effort: Pulmonary effort is normal. No respiratory distress.     Breath sounds: Normal breath sounds.  Abdominal:     General: There is no distension.     Palpations: Abdomen is soft.     Tenderness: There is no abdominal tenderness.  Musculoskeletal:        General: Swelling, tenderness and signs of injury present.     Cervical back: Neck supple.     Comments: Extremely muscular physique.  There is tenderness, ecchymosis and mild swelling of the proximal right upper arm at the expected insertion of the pectoralis muscle. Increased pain with engagement of the R pec.   Skin:     General: Skin is warm and dry.  Neurological:     Mental Status: He is alert.  Psychiatric:        Behavior: Behavior normal.        Thought Content: Thought content normal.     ED Results / Procedures / Treatments   Labs (all labs ordered are listed, but only abnormal results are displayed) Labs Reviewed - No data to display  EKG None  Radiology No results found.  Procedures Procedures (including critical care time)  Medications Ordered in ED Medications - No data to display  ED Course  I have reviewed the triage vital signs and the nursing notes.  Pertinent labs & imaging results that were available during my care of the patient were reviewed by me and considered in my medical decision making (see chart for details).    MDM Rules/Calculators/A&P                          44yM with symptoms/exam consistent with R pectoral injury at insertion into humerus. He declined a sling for comfort purposes. Explained that he is not to place significant load on it. Needs to follow-up with sports medicine or orthopedic surgery.    Final Clinical Impression(s) / ED Diagnoses Final diagnoses:  Rupture of pectoralis major muscle, initial encounter    Rx / DC Orders ED Discharge Orders    None       Virgel Manifold, MD 05/14/20 1216

## 2020-05-10 NOTE — ED Triage Notes (Signed)
Bench pressing at the gym last night and felt something pop in right shoulder.  This morning he has a large purple/red bruise in that area.

## 2020-05-10 NOTE — Discharge Instructions (Addendum)
Follow-up with your orthopedist or with Sports Medicine.   600 mg of ibuprofen every 6 hours as needed.  Sling for comfort.   No activities that place a significant load on your R pectorals.

## 2020-05-13 ENCOUNTER — Other Ambulatory Visit: Payer: Self-pay

## 2020-05-13 ENCOUNTER — Ambulatory Visit: Payer: Self-pay

## 2020-05-13 ENCOUNTER — Encounter: Payer: Self-pay | Admitting: Family Medicine

## 2020-05-13 ENCOUNTER — Ambulatory Visit (INDEPENDENT_AMBULATORY_CARE_PROVIDER_SITE_OTHER): Payer: Commercial Managed Care - PPO | Admitting: Family Medicine

## 2020-05-13 VITALS — BP 176/107 | HR 97 | Ht 73.0 in | Wt 310.0 lb

## 2020-05-13 DIAGNOSIS — S29011A Strain of muscle and tendon of front wall of thorax, initial encounter: Secondary | ICD-10-CM | POA: Diagnosis not present

## 2020-05-13 DIAGNOSIS — M25511 Pain in right shoulder: Secondary | ICD-10-CM

## 2020-05-13 NOTE — Assessment & Plan Note (Signed)
Injury occurred 6/13. The pec major tendon proximally appears to be intact. The midbelly appears ruptured.  - counseled on supportive care  - referral to ortho

## 2020-05-13 NOTE — Progress Notes (Signed)
Justin Watkins - 44 y.o. male MRN [id number]  Date of birth: 1976-06-18  SUBJECTIVE:  Including CC & ROS.  Chief Complaint  Patient presents with  . Arm Injury    right    Justin Watkins is a 44 y.o. male that is presenting with right chest wheezing.  He was at the benchpress and was performing often felt a change in his pectoralis major.  Since that time he has had weakness with pushing as well as significant ecchymosis.  He is seen in the emergency department on 6/14.  Denies any numbness or tingling.   Review of Systems See HPI   HISTORY: Past Medical, Surgical, Social, and Family History Reviewed & Updated per EMR.   Pertinent Historical Findings include:  Past Medical History:  Diagnosis Date  . Diabetes mellitus without complication (HCC)   . Hypertension   . TIA (transient ischemic attack)     Past Surgical History:  Procedure Laterality Date  . BRAIN SURGERY    . FOOT SURGERY      No family history on file.  Social History   Socioeconomic History  . Marital status: Married    Spouse name: Not on file  . Number of children: Not on file  . Years of education: Not on file  . Highest education level: Not on file  Occupational History  . Not on file  Tobacco Use  . Smoking status: Never Smoker  . Smokeless tobacco: Never Used  Substance and Sexual Activity  . Alcohol use: No    Alcohol/week: 0.0 standard drinks  . Drug use: No  . Sexual activity: Not on file  Other Topics Concern  . Not on file  Social History Narrative  . Not on file   Social Determinants of Health   Financial Resource Strain:   . Difficulty of Paying Living Expenses:   Food Insecurity:   . Worried About Programme researcher, broadcasting/film/video in the Last Year:   . Barista in the Last Year:   Transportation Needs:   . Freight forwarder (Medical):   Marland Kitchen Lack of Transportation (Non-Medical):   Physical Activity:   . Days of Exercise per Week:   . Minutes of Exercise per Session:   Stress:    . Feeling of Stress :   Social Connections:   . Frequency of Communication with Friends and Family:   . Frequency of Social Gatherings with Friends and Family:   . Attends Religious Services:   . Active Member of Clubs or Organizations:   . Attends Banker Meetings:   Marland Kitchen Marital Status:   Intimate Partner Violence:   . Fear of Current or Ex-Partner:   . Emotionally Abused:   Marland Kitchen Physically Abused:   . Sexually Abused:      PHYSICAL EXAM:  VS: BP (!) 176/107   Pulse 97   Ht 6\' 1"  (1.854 m)   Wt (!) 310 lb (140.6 kg)   BMI 40.90 kg/m  Physical Exam Gen: NAD, alert, cooperative with exam, well-appearing MSK:  Right shoulder/chest: Significant ecchymosis of the arm and axilla. No pectoralis major tendon appreciated in the axilla. Weakness with push. Normal shoulder range of motion. There is an asymmetric appearance of the chest with the pectoralis major on the right being more protruded. Neurovascularly intact  Limited ultrasound: Right shoulder/chest:  Biceps tendon has a slight effusion but intact Subscapularis intact. Axilla view of the pectoralis major shows a disruption of the midportion of the  pectoralis major tendon.  The deep portion appears to be ruptured while the most proximal portion appears to be intact still.  Summary: Pectoralis major rupture.  Ultrasound and interpretation by Clearance Coots, MD   ASSESSMENT & PLAN:   Pectoralis muscle rupture Injury occurred 6/13. The pec major tendon proximally appears to be intact. The midbelly appears ruptured.  - counseled on supportive care  - referral to ortho

## 2020-05-13 NOTE — Patient Instructions (Signed)
Nice to meet you Please let us know if they can't get you in soon.   Please send me a message in MyChart with any questions or updates.  Please see [place] back as needed.   --Dr. Jordan Likes

## 2020-07-16 ENCOUNTER — Encounter: Payer: Self-pay | Admitting: Podiatry

## 2020-07-16 ENCOUNTER — Other Ambulatory Visit: Payer: Self-pay

## 2020-07-16 ENCOUNTER — Ambulatory Visit (INDEPENDENT_AMBULATORY_CARE_PROVIDER_SITE_OTHER): Payer: Commercial Managed Care - PPO | Admitting: Podiatry

## 2020-07-16 ENCOUNTER — Ambulatory Visit (INDEPENDENT_AMBULATORY_CARE_PROVIDER_SITE_OTHER): Payer: Commercial Managed Care - PPO

## 2020-07-16 DIAGNOSIS — M778 Other enthesopathies, not elsewhere classified: Secondary | ICD-10-CM | POA: Diagnosis not present

## 2020-07-16 DIAGNOSIS — M14679 Charcot's joint, unspecified ankle and foot: Secondary | ICD-10-CM | POA: Diagnosis not present

## 2020-07-16 DIAGNOSIS — M14671 Charcot's joint, right ankle and foot: Secondary | ICD-10-CM | POA: Diagnosis not present

## 2020-07-16 DIAGNOSIS — M14672 Charcot's joint, left ankle and foot: Secondary | ICD-10-CM

## 2020-07-16 DIAGNOSIS — M779 Enthesopathy, unspecified: Secondary | ICD-10-CM

## 2020-07-21 NOTE — Progress Notes (Signed)
Subjective:   Patient ID: Justin Watkins, male   DOB: 44 y.o.   MRN: [id number]   HPI Patient presents stating that he has had collapse of his medial longitudinal arch both feet and the left ended up needing surgery and he is concerned about the flatfoot his diabetes and he is trying to lose weight currently.  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       Objective:  Physical Exam Vitals and nursing note reviewed.  Constitutional:      Appearance: He is well-developed.  Pulmonary:     Effort: Pulmonary effort is normal.  Musculoskeletal:        General: Normal range of motion.  Skin:    General: Skin is warm.  Neurological:     Mental Status: He is alert.     Vascular status was found to be intact with diminishment of sharp dull vibratory bilateral.  Patient does have flatfoot deformity and has incisions on the left from previous fusion procedures and has a reactive type tissue on the inside of the right arch mildly tender.  Patient is obese and is trying to lose weight.  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  At this point I have recommended orthotics to try to lift the arch and did discuss at one point future fusion for the right may be necessary.  Patient will be seen back and was casted today by ped orthotist  X-rays indicate that there is some hypermobility of the first metatarsal cuneiform joint with moderate depression of the arch right and fusion of the first metatarsocuneiform second metatarsocuneiform third metatarsocuneiform left

## 2020-08-05 ENCOUNTER — Other Ambulatory Visit: Payer: Commercial Managed Care - PPO | Admitting: Orthotics

## 2020-08-06 ENCOUNTER — Other Ambulatory Visit: Payer: Commercial Managed Care - PPO | Admitting: Orthotics

## 2020-08-06 ENCOUNTER — Other Ambulatory Visit: Payer: Self-pay

## 2021-09-10 ENCOUNTER — Encounter (HOSPITAL_BASED_OUTPATIENT_CLINIC_OR_DEPARTMENT_OTHER): Payer: Self-pay

## 2021-09-10 ENCOUNTER — Emergency Department (HOSPITAL_BASED_OUTPATIENT_CLINIC_OR_DEPARTMENT_OTHER): Payer: Commercial Managed Care - PPO

## 2021-09-10 ENCOUNTER — Emergency Department (HOSPITAL_BASED_OUTPATIENT_CLINIC_OR_DEPARTMENT_OTHER)
Admission: EM | Admit: 2021-09-10 | Discharge: 2021-09-10 | Disposition: A | Discharge status: Home / Self Care | Payer: Commercial Managed Care - PPO | Attending: Emergency Medicine | Admitting: Emergency Medicine

## 2021-09-10 ENCOUNTER — Other Ambulatory Visit: Payer: Self-pay

## 2021-09-10 DIAGNOSIS — D649 Anemia, unspecified: Secondary | ICD-10-CM | POA: Insufficient documentation

## 2021-09-10 DIAGNOSIS — L03031 Cellulitis of right toe: Secondary | ICD-10-CM | POA: Diagnosis not present

## 2021-09-10 DIAGNOSIS — R2241 Localized swelling, mass and lump, right lower limb: Secondary | ICD-10-CM | POA: Diagnosis not present

## 2021-09-10 DIAGNOSIS — R000 Tachycardia, unspecified: Secondary | ICD-10-CM | POA: Diagnosis not present

## 2021-09-10 DIAGNOSIS — E669 Obesity, unspecified: Secondary | ICD-10-CM | POA: Insufficient documentation

## 2021-09-10 DIAGNOSIS — E1140 Type 2 diabetes mellitus with diabetic neuropathy, unspecified: Secondary | ICD-10-CM | POA: Diagnosis not present

## 2021-09-10 DIAGNOSIS — A5216 Charcot's arthropathy (tabetic): Secondary | ICD-10-CM | POA: Insufficient documentation

## 2021-09-10 DIAGNOSIS — Z7982 Long term (current) use of aspirin: Secondary | ICD-10-CM | POA: Diagnosis not present

## 2021-09-10 DIAGNOSIS — X58XXXA Exposure to other specified factors, initial encounter: Secondary | ICD-10-CM | POA: Insufficient documentation

## 2021-09-10 DIAGNOSIS — Z794 Long term (current) use of insulin: Secondary | ICD-10-CM | POA: Diagnosis not present

## 2021-09-10 DIAGNOSIS — Z7984 Long term (current) use of oral hypoglycemic drugs: Secondary | ICD-10-CM | POA: Diagnosis not present

## 2021-09-10 DIAGNOSIS — E119 Type 2 diabetes mellitus without complications: Secondary | ICD-10-CM | POA: Diagnosis not present

## 2021-09-10 DIAGNOSIS — S99921A Unspecified injury of right foot, initial encounter: Secondary | ICD-10-CM | POA: Diagnosis present

## 2021-09-10 DIAGNOSIS — I10 Essential (primary) hypertension: Secondary | ICD-10-CM | POA: Diagnosis not present

## 2021-09-10 DIAGNOSIS — S91301A Unspecified open wound, right foot, initial encounter: Secondary | ICD-10-CM | POA: Diagnosis not present

## 2021-09-10 DIAGNOSIS — S91331A Puncture wound without foreign body, right foot, initial encounter: Secondary | ICD-10-CM

## 2021-09-10 LAB — CBC WITH DIFFERENTIAL/PLATELET
Abs Immature Granulocytes: 0.01 10*3/uL (ref 0.00–0.07)
Basophils Absolute: 0 10*3/uL (ref 0.0–0.1)
Basophils Relative: 1 %
Eosinophils Absolute: 0.1 10*3/uL (ref 0.0–0.5)
Eosinophils Relative: 2 %
HCT: 35.2 % — ABNORMAL LOW (ref 39.0–52.0)
Hemoglobin: 11.8 g/dL — ABNORMAL LOW (ref 13.0–17.0)
Immature Granulocytes: 0 %
Lymphocytes Relative: 27 %
Lymphs Abs: 1.2 10*3/uL (ref 0.7–4.0)
MCH: 30.2 pg (ref 26.0–34.0)
MCHC: 33.5 g/dL (ref 30.0–36.0)
MCV: 90 fL (ref 80.0–100.0)
Monocytes Absolute: 0.4 10*3/uL (ref 0.1–1.0)
Monocytes Relative: 9 %
Neutro Abs: 2.6 10*3/uL (ref 1.7–7.7)
Neutrophils Relative %: 61 %
Platelets: 176 10*3/uL (ref 150–400)
RBC: 3.91 MIL/uL — ABNORMAL LOW (ref 4.22–5.81)
RDW: 13.5 % (ref 11.5–15.5)
WBC: 4.3 10*3/uL (ref 4.0–10.5)
nRBC: 0 % (ref 0.0–0.2)

## 2021-09-10 LAB — BASIC METABOLIC PANEL
Anion gap: 7 (ref 5–15)
BUN: 31 mg/dL — ABNORMAL HIGH (ref 6–20)
CO2: 26 mmol/L (ref 22–32)
Calcium: 8.9 mg/dL (ref 8.9–10.3)
Chloride: 106 mmol/L (ref 98–111)
Creatinine, Ser: 1.94 mg/dL — ABNORMAL HIGH (ref 0.61–1.24)
GFR, Estimated: 42 mL/min — ABNORMAL LOW (ref >60)
Glucose, Bld: 108 mg/dL — ABNORMAL HIGH (ref 70–99)
Potassium: 4.2 mmol/L (ref 3.5–5.1)
Sodium: 139 mmol/L (ref 135–145)

## 2021-09-10 LAB — CBG MONITORING, ED: Glucose-Capillary: 197 mg/dL — ABNORMAL HIGH (ref 70–99)

## 2021-09-10 LAB — LACTIC ACID, PLASMA: Lactic Acid, Venous: 1.4 mmol/L (ref 0.5–1.9)

## 2021-09-10 MED ORDER — PIPERACILLIN-TAZOBACTAM 3.375 G IVPB 30 MIN
3.3750 g | Freq: Once | INTRAVENOUS | Status: AC
  Administered 2021-09-10: 3.375 g via INTRAVENOUS
  Filled 2021-09-10: qty 50

## 2021-09-10 MED ORDER — SULFAMETHOXAZOLE-TRIMETHOPRIM 800-160 MG PO TABS: 1.0000 | ORAL_TABLET | Freq: Two times a day (BID) | ORAL | 0 refills | Status: AC

## 2021-09-10 MED ORDER — SODIUM CHLORIDE 0.9 % IV SOLN: INTRAVENOUS | Status: DC | PRN

## 2021-09-10 NOTE — ED Notes (Signed)
US at bedside

## 2021-09-10 NOTE — ED Provider Notes (Signed)
[facility] EMERGENCY DEPARTMENT Provider Note   CSN: [id number] Arrival date & time: 09/10/21  1623     History Chief Complaint  Patient presents with   Wound Check    Justin Watkins is a 45 y.o. male with history of severe diabetic neuropathy who presents with concern for injury to the right second toe as well as concern for developing infection in that foot.  Injury that happened on Thursday (48 hours ago) but patient states that he was "handling important project my new job and I could not come in prior to today.".  Patient denies any sensation in his feet bilaterally but and also denies any fevers, chills, nausea, or vomiting.  Does also endorse progressively worsening swelling in the right lower leg.  I personally read this patient's medical records.  His history of type 2 diabetes, hypertension, and TIA.  Severe diabetic neuropathy.  He is not anticoagulated.  HPI     Past Medical History:  Diagnosis Date   Diabetes mellitus without complication (HCC)    Hypertension    TIA (transient ischemic attack)     Patient Active Problem List   Diagnosis Date Noted   Pectoralis muscle rupture 05/13/2020   Ingrown nail of great toe of left foot 06/06/2018   Diabetic neuropathy (HCC) 01/20/2015   Metatarsal deformity 01/20/2015   Equinus deformity of foot, acquired 01/20/2015   Pronation deformity of ankle, acquired 01/20/2015    Past Surgical History:  Procedure Laterality Date   BRAIN SURGERY     FOOT SURGERY         History reviewed. No pertinent family history.  Social History   Tobacco Use   Smoking status: Never   Smokeless tobacco: Never  Substance Use Topics   Alcohol use: No    Alcohol/week: 0.0 standard drinks   Drug use: No    Home Medications Prior to Admission medications   Medication Sig Start Date End Date Taking? Authorizing Provider  sulfamethoxazole-trimethoprim (BACTRIM DS) 800-160 MG tablet Take 1 tablet by mouth 2 (two) times  daily for 7 days. 09/10/21 09/17/21 Yes Arian Mcquitty, Eugene Gavia, PA-C  aspirin 81 MG EC tablet aspirin 81 mg tablet,delayed release  TAKE 1 TABLET BY MOUTH TWICE A DAY 01/17/16   [provider]  B-D UF III MINI PEN NEEDLES 31G X 5 MM MISC USE TO CHECK SUGAR 4 TIMES DAILY 03/20/20   [provider]  carvedilol (COREG) 12.5 MG tablet Take by mouth. 06/21/20 09/19/20  [provider]  clotrimazole-betamethasone (LOTRISONE) cream Apply topically. 04/03/20   [provider]  Continuous Blood Gluc Receiver (FREESTYLE LIBRE 2 READER) DEVI USE TO CHECK BLOOD SUGAR WHEN NECESSARY 05/21/20   [provider]  empagliflozin (JARDIANCE) 10 MG TABS tablet Take by mouth. 06/01/17   [provider]  fluconazole (DIFLUCAN) 100 MG tablet Take 100 mg by mouth daily. 04/03/20   [provider]  furosemide (LASIX) 20 MG tablet Take 40 mg by mouth daily. 05/04/20   [provider]  glipiZIDE (GLUCOTROL XL) 10 MG 24 hr tablet glipizide ER 10 mg tablet, extended release 24 hr 05/11/17   [provider]  HUMALOG KWIKPEN 100 UNIT/ML KwikPen TAKE 15 25 UNITS TWICE BEFORE MEALS (MDD 50 UNITS) 05/04/20   [provider]  HYDROcodone-acetaminophen (NORCO/VICODIN) 5-325 MG tablet hydrocodone 5 mg-acetaminophen 325 mg tablet  TAKE 1 TABLET BY MOUTH EVERY 4 HOURS AS NEEDED    [provider]  LANTUS SOLOSTAR 100 UNIT/ML Solostar Pen  SMARTSIG:50 Unit(s) SUB-Q Daily 05/04/20   [provider]  lisinopril (ZESTRIL) 40 MG tablet lisinopril 40 mg tablet  TAKE 1 TABLET BY MOUTH EVERY DAY 05/11/17   [provider]  losartan (COZAAR) 100 MG tablet Take 100 mg by mouth daily. 03/08/20   [provider]  Menthol-Zinc Oxide (GOLD BOND EXTRA STRENGTH) POWD Apply topically. 04/03/20   [provider]  metFORMIN (GLUCOPHAGE-XR) 500 MG 24 hr tablet Take by mouth. 05/11/17   [provider]  [facility] ULTRA test strip 3  (three) times daily. 04/24/20   [provider]  oxyCODONE (OXY IR/ROXICODONE) 5 MG immediate release tablet oxycodone 5 mg tablet  TAKE 1 TABLETBY MOUTH EVERY 4 HOURS AS NEEDED FOR 5 DAYS.    [provider]  pioglitazone (ACTOS) 15 MG tablet Take 15 mg by mouth daily. 02/26/20   [provider]  potassium chloride (KLOR-CON M10) 10 MEQ tablet Klor-Con M10 mEq tablet,extended release    [provider]  rosuvastatin (CRESTOR) 10 MG tablet Take 10 mg by mouth daily. 03/05/20   [provider]  sildenafil (VIAGRA) 100 MG tablet Take one hr prior to intercourse 09/18/16   [provider]  testosterone cypionate (DEPOTESTOSTERONE CYPIONATE) 200 MG/ML injection Inject 200 mg into the muscle every 14 (fourteen) days. 02/04/20   [provider]  tolnaftate (TINACTIN) 1 % powder Apply daily to groin intertriginous areas 04/03/20   [provider]  traMADol (ULTRAM) 50 MG tablet tramadol 50 mg tablet  TAKE 1 TABLET BY MOUTH EVERY 4 HOURS AS NEEDED    [provider]  Vitamin D, Ergocalciferol, (DRISDOL) 1.25 MG (50000 UNIT) CAPS capsule Take 50,000 Units by mouth once a week. 03/17/20   [provider]    Allergies    Doxycycline and Amoxicillin  Review of Systems   Review of Systems  Constitutional: Negative.   HENT: Negative.    Respiratory: Negative.    Cardiovascular:  Positive for leg swelling. Negative for chest pain and palpitations.  Gastrointestinal: Negative.   Genitourinary: Negative.   Musculoskeletal: Negative.   Skin:  Positive for wound.       Concern for infection and large wound to left foot.   Neurological: Negative.    Physical Exam Updated Vital Signs BP (!) 152/88   Pulse 89   Temp 98.2 F (36.8 C) (Oral)   Resp 16   Ht  (1.854 m)   Wt 136.1 kg   SpO2 99%   BMI 39.58 kg/m   Physical Exam Vitals and nursing note reviewed.  Constitutional:      Appearance: He is obese. He is  not ill-appearing or toxic-appearing.  HENT:     Head: Normocephalic and atraumatic.     Mouth/Throat:     Mouth: Mucous membranes are moist.     Pharynx: No oropharyngeal exudate or posterior oropharyngeal erythema.  Eyes:     General:        Right eye: No discharge.        Left eye: No discharge.     Extraocular Movements: Extraocular movements intact.     Conjunctiva/sclera: Conjunctivae normal.     Pupils: Pupils are equal, round, and reactive to light.  Cardiovascular:     Rate and Rhythm: Normal rate and regular rhythm.     Pulses:          Dorsalis pedis pulses are 1+ on the right side and 1+ on the left side.     Heart  sounds: Normal heart sounds. No murmur heard. Pulmonary:     Effort: Pulmonary effort is normal. No respiratory distress.     Breath sounds: Normal breath sounds. No wheezing or rales.  Abdominal:     General: Bowel sounds are normal. There is no distension.     Palpations: Abdomen is soft.     Tenderness: There is no abdominal tenderness. There is no guarding or rebound.  Musculoskeletal:        General: No deformity.     Cervical back: Neck supple.     Left foot: Charcot foot present.       Feet:  Feet:     Right foot:     Skin integrity: Ulcer, skin breakdown and dry skin present.     Toenail Condition: Right toenails are abnormally thick. Fungal disease present.    Left foot:     Skin integrity: Skin breakdown present.     Toenail Condition: Left toenails are abnormally thick.     Comments: Concern for developing infection in the distal aspect of all toes of the right foot.  Additionally there is extensive swelling and warmth to the touch of the proximal right foot extending up the right lower leg to approximately 4 cm below the knee.  No associated tenderness to palpation but pitting edema of the right lower extremity. Skin:    General: Skin is warm and dry.     Capillary Refill: Capillary refill takes less than 2 seconds.     Findings: Wound  present.  Neurological:     General: No focal deficit present.     Mental Status: He is alert and oriented to person, place, and time. Mental status is at baseline.     Sensory: Sensory deficit present.     Comments: Severe Bilateral feet diabetic neuropathy and   Psychiatric:        Mood and Affect: Mood normal.           ED Results / Procedures / Treatments   Labs (all labs ordered are listed, but only abnormal results are displayed) Labs Reviewed  CBC WITH DIFFERENTIAL/PLATELET - Abnormal; Notable for the following components:      Result Value   RBC 3.91 (*)    Hemoglobin 11.8 (*)    HCT 35.2 (*)    All other components within normal limits  BASIC METABOLIC PANEL - Abnormal; Notable for the following components:   Glucose, Bld 108 (*)    BUN 31 (*)    Creatinine, Ser 1.94 (*)    GFR, Estimated 42 (*)    All other components within normal limits  CBG MONITORING, ED - Abnormal; Notable for the following components:   Glucose-Capillary 197 (*)    All other components within normal limits  LACTIC ACID, PLASMA  LACTIC ACID, PLASMA    EKG None  Radiology US Venous Img Lower Right (DVT Study)  Result Date: 09/10/2021 CLINICAL DATA:  Diabetic foot infection, leg swelling EXAM: RIGHT LOWER EXTREMITY VENOUS DOPPLER ULTRASOUND TECHNIQUE: Gray-scale sonography with compression, as well as color and duplex ultrasound, were performed to evaluate the deep venous system(s) from the level of the common femoral vein through the popliteal and proximal calf veins. COMPARISON:  None. FINDINGS: VENOUS Normal compressibility of the common femoral, superficial femoral, and popliteal veins, as well as the visualized calf veins. Visualized portions of profunda femoral vein and great saphenous vein unremarkable. No filling defects to suggest DVT on grayscale or color Doppler imaging. Doppler waveforms show  normal direction of venous flow, normal respiratory plasticity and response to  augmentation. Limited views of the contralateral common femoral vein are unremarkable. OTHER None. Limitations: none IMPRESSION: Negative. Electronically Signed   By: Charlett Nose M.D.   On: 09/10/2021 18:07   DG Foot Complete Left  Result Date: 09/10/2021 CLINICAL DATA:  Diabetic foot infection. EXAM: LEFT FOOT - COMPLETE 3+ VIEW COMPARISON:  None. FINDINGS: Hardware noted in the 1st through 3rd rays across the tarsal metatarsal joints. No acute bony abnormality. Specifically, no fracture, subluxation, or dislocation. No bone destruction to suggest osteomyelitis. IMPRESSION: No acute bony abnormality. Electronically Signed   By: Charlett Nose M.D.   On: 09/10/2021 17:38   DG Foot Complete Right  Result Date: 09/10/2021 CLINICAL DATA:  diabetic foot infecton EXAM: RIGHT FOOT COMPLETE - 3+ VIEW COMPARISON:  None. FINDINGS: There is no evidence of fracture or dislocation. There is no evidence of arthropathy or other focal bone abnormality. Soft tissues are unremarkable. No bone destruction to suggest osteomyelitis. IMPRESSION: No acute bony abnormality. Electronically Signed   By: Charlett Nose M.D.   On: 09/10/2021 17:42    Procedures .Marland KitchenIncision and Drainage  Date/Time: 09/10/2021 7:14 PM Performed by: Paris Lore, PA-C Authorized by: Paris Lore, PA-C   Consent:    Consent obtained:  Verbal   Consent given by:  Patient   Risks discussed:  Bleeding, incomplete drainage, pain and damage to other organs   Alternatives discussed:  No treatment Universal protocol:    Procedure explained and questions answered to patient or proxy's satisfaction: yes     Relevant documents present and verified: yes     Test results available : yes     Imaging studies available: yes     Required blood products, implants, devices, and special equipment available: yes     Site/side marked: yes     Immediately prior to procedure, a time out was called: yes     Patient identity confirmed:   Verbally with patient Location:    Type:  Abscess   Size:  Paronychia, right medial great toe   Location:  Lower extremity   Lower extremity location:  Toe   Toe location:  R big toe Pre-procedure details:    Skin preparation:  Betadine Anesthesia:    Anesthesia method:  None (Patient with severe diabetic neuropathy, no anesthesia necessary.) Procedure type:    Complexity:  Simple Procedure details:    Incision types:  Single straight   Incision depth:  Subcutaneous   Wound management:  Irrigated with saline and extensive cleaning   Drainage:  Serosanguinous   Drainage amount:  Moderate   Wound treatment:  Wound left open   Packing materials:  None Post-procedure details:    Procedure completion:  Tolerated well, no immediate complications   Medications Ordered in ED Medications  0.9 %  sodium chloride infusion (has no administration in time range)  piperacillin-tazobactam (ZOSYN) IVPB 3.375 g (0 g Intravenous Stopped 09/10/21 1829)    ED Course  I have reviewed the triage vital signs and the nursing notes.  Pertinent labs & imaging results that were available during my care of the patient were reviewed by me and considered in my medical decision making (see chart for details).    MDM Rules/Calculators/A&P                         45 year old male with severe diabetic neuropathy presents with concern for wounds to  the right distal foot and also concern for infection.  Differential diagnosis includes but limited to diabetic foot infection, paronychia, laceration, cellulitis, osteomyelitis, erysipelas.  Hypertensive on intake, very mild tachycardia with heart rate of 101; vital signs otherwise normal.  Cardiopulmonary exam is normal.  Abdominal exam is benign.  Skin exam as above with wounds to the right distal 2nd toe and concern for infection all 5 digits of the right foot.  Concern for developing infection in the left fifth toe.   CBC without leukocytosis, mild anemia with  HBG OF 11.8. BMP with creatinine 1.9 per patient and her baseline.  Lactic acid negative, 1.4.  Plain films negative for evidence of osteomyelitis and DVT study was negative. Negative for reported to me at this time.  Paronychia drained as above and partial amputation of the toe was completely removed, thoroughly irrigated, and dressed with antibiotic ointment.  Recommend close outpatient podiatry follow-up for next week, will discharge with antibiotics. Case discussed with attending who agrees with disposition plan.   Finbar voiced understanding of his medical evaluation and treatment plan.  Each of his questions was answered to his expressed satisfaction.  Return precautions given.  Patient is well-appearing, stable, and appropriate for discharge at this time.  This chart was dictated using voice recognition software, Dragon. Despite the best efforts of this provider to proofread and correct errors, errors may still occur which can change documentation meaning.   Final Clinical Impression(s) / ED Diagnoses Final diagnoses:  Penetrating wound of right foot, initial encounter    Rx / DC Orders ED Discharge Orders          Ordered    sulfamethoxazole-trimethoprim (BACTRIM DS) 800-160 MG tablet  2 times daily        09/10/21 1910             Jaidy Cottam, Idelia Salm 09/10/21 1918    Gwyneth Sprout, MD 09/11/21 1621

## 2021-09-10 NOTE — Discharge Instructions (Signed)
You were seen in the ER today for your foot wound and infection. You were administered IV antibiotics in the ER. Your xrays did not reveal any sign of bone infection today. You have been prescribed antibiotics to take twice daily for the next week. Please call your podiatrist first thing Monday morning to schedule a follow up appointment.   Please dress the wound daily with antibiotic ointment and clean dressings any time it gets wet.   Return to the ER if you develop any worsening swelling, redness, fevers, or chills.

## 2021-09-10 NOTE — ED Triage Notes (Addendum)
Pt c/o injury/wounds to right foot (1st and 2nd toe). Hx of neuropathy. States his foot was bleeding on Thursday. Denies fevers.

## 2021-09-12 ENCOUNTER — Ambulatory Visit (INDEPENDENT_AMBULATORY_CARE_PROVIDER_SITE_OTHER): Payer: Commercial Managed Care - PPO | Admitting: Podiatry

## 2021-09-12 ENCOUNTER — Other Ambulatory Visit: Payer: Self-pay

## 2021-09-12 DIAGNOSIS — L97511 Non-pressure chronic ulcer of other part of right foot limited to breakdown of skin: Secondary | ICD-10-CM | POA: Diagnosis not present

## 2021-09-12 MED ORDER — SULFAMETHOXAZOLE-TRIMETHOPRIM 800-160 MG PO TABS: 1.0000 | ORAL_TABLET | Freq: Two times a day (BID) | ORAL | 1 refills | Status: DC

## 2021-09-14 NOTE — Progress Notes (Signed)
Subjective:   Patient ID: Justin Watkins, male   DOB: 45 y.o.   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  Has been to the hospital placed on antibiotic and is doing better but wants it checked   ROS      Objective:  Physical Exam  Neurovascular status is unchanged with patient having circulatory status but does have diminished neurological and is doing a better job of controlling his sugar but has unfortunate issues associated from the past.  He has breakdown of tissue on the right second digit and mildly on the third and fourth and hallux with no subcutaneous exposure and erythema edema localized with no proximal edema erythema drainage and no systemic signs currently of infection and is taking Septra currently     Assessment:  Low-grade ulceration lesser digits right with no indications currently of subcutaneous exposure     Plan:  8 NP reviewed his x-rays and hopefully we are not dealing with osteomyelitis and he appears to be improving and I discussed soaks I gave him a buttress pad to lift the toes I gave him a surgical shoe to wear when possible and he will do daily inspections of any further breakdown or any systemic signs of infection were to occur he is to reappoint immediately or go to the emergency room and he does understand he runs high chances long-term for amputation especially if he does not take care of himself and I want him to get a wider bigger shoe and wear good socks at work.  I do want him back on the antibiotic for 2 extra weeks and I wrote him prescription today

## 2022-03-30 ENCOUNTER — Ambulatory Visit (INDEPENDENT_AMBULATORY_CARE_PROVIDER_SITE_OTHER): Payer: Commercial Managed Care - PPO | Admitting: Podiatry

## 2022-03-30 ENCOUNTER — Encounter: Payer: Self-pay | Admitting: Podiatry

## 2022-03-30 DIAGNOSIS — L03031 Cellulitis of right toe: Secondary | ICD-10-CM | POA: Diagnosis not present

## 2022-03-30 DIAGNOSIS — L97511 Non-pressure chronic ulcer of other part of right foot limited to breakdown of skin: Secondary | ICD-10-CM | POA: Diagnosis not present

## 2022-03-30 DIAGNOSIS — B351 Tinea unguium: Secondary | ICD-10-CM | POA: Diagnosis not present

## 2022-03-30 NOTE — Progress Notes (Signed)
Subjective:  ? ?Patient ID: Justin Watkins, male   DOB: 46 y.o.   MRN: [id number]  ? ?HPI ?Patient presents stating that he is having a lot of problems with the second nail right and has an ingrown toenail with a possible section of the right hallux nail and he is doing better from where he had treated him last year ? ? ?ROS ? ? ?   ?Objective:  ?Physical Exam  ?Neurovascular status unchanged with patient is doing a good job on his A1c with last A1c being 6.7 who has a severely thickened nail and callus formation distal second toe right and has a redness of the right hallux lateral side with a formation ? ?   ?Assessment:  ?Damage to the second nailbed right with thick keratotic tissue along with paronychia infection right hallux ? ?   ?Plan:  ?H&P reviewed conditions and the chances for eventual amputation which still may be necessary.  Today I did aggressive debridement and I removed proud flesh from that right hallux and second digit.  It all appears to be local and healthy patient will be seen back to recheck and will initiate soaks and will be seen back if symptoms do not get better or any issues were to occur ?   ? ? ?

## 2022-03-30 NOTE — Patient Instructions (Signed)

## 2022-06-02 ENCOUNTER — Other Ambulatory Visit: Payer: Self-pay | Admitting: Podiatry

## 2022-06-02 ENCOUNTER — Telehealth: Payer: Self-pay | Admitting: Podiatry

## 2022-06-02 IMAGING — DX DG FOOT COMPLETE 3+V*R*
3 series · 3 of 3 positions shown · non-contrast
Comparison: None.

CLINICAL DATA: diabetic foot infecton

EXAM:
RIGHT FOOT COMPLETE - 3+ VIEW

[foot ap]
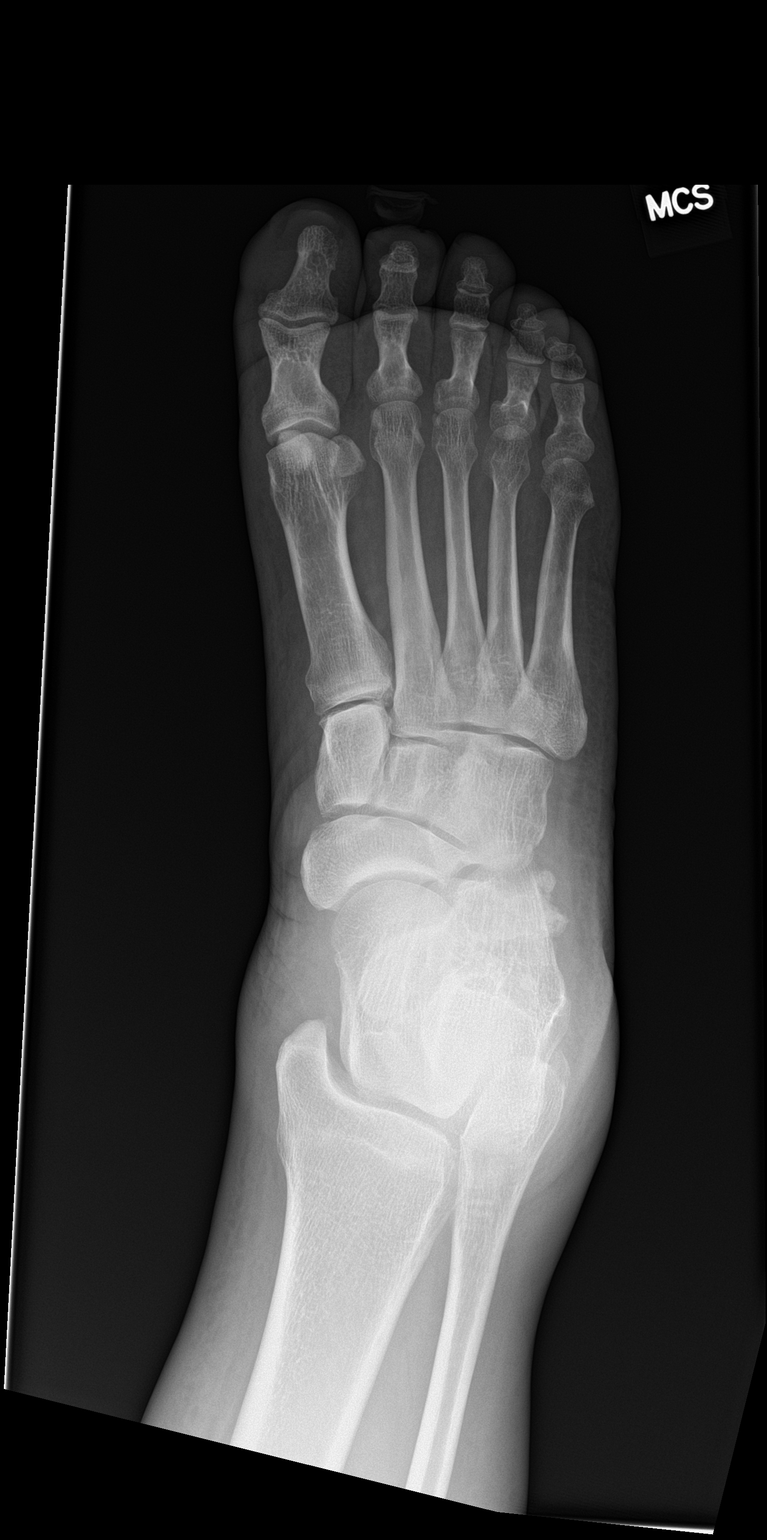

[foot obl]
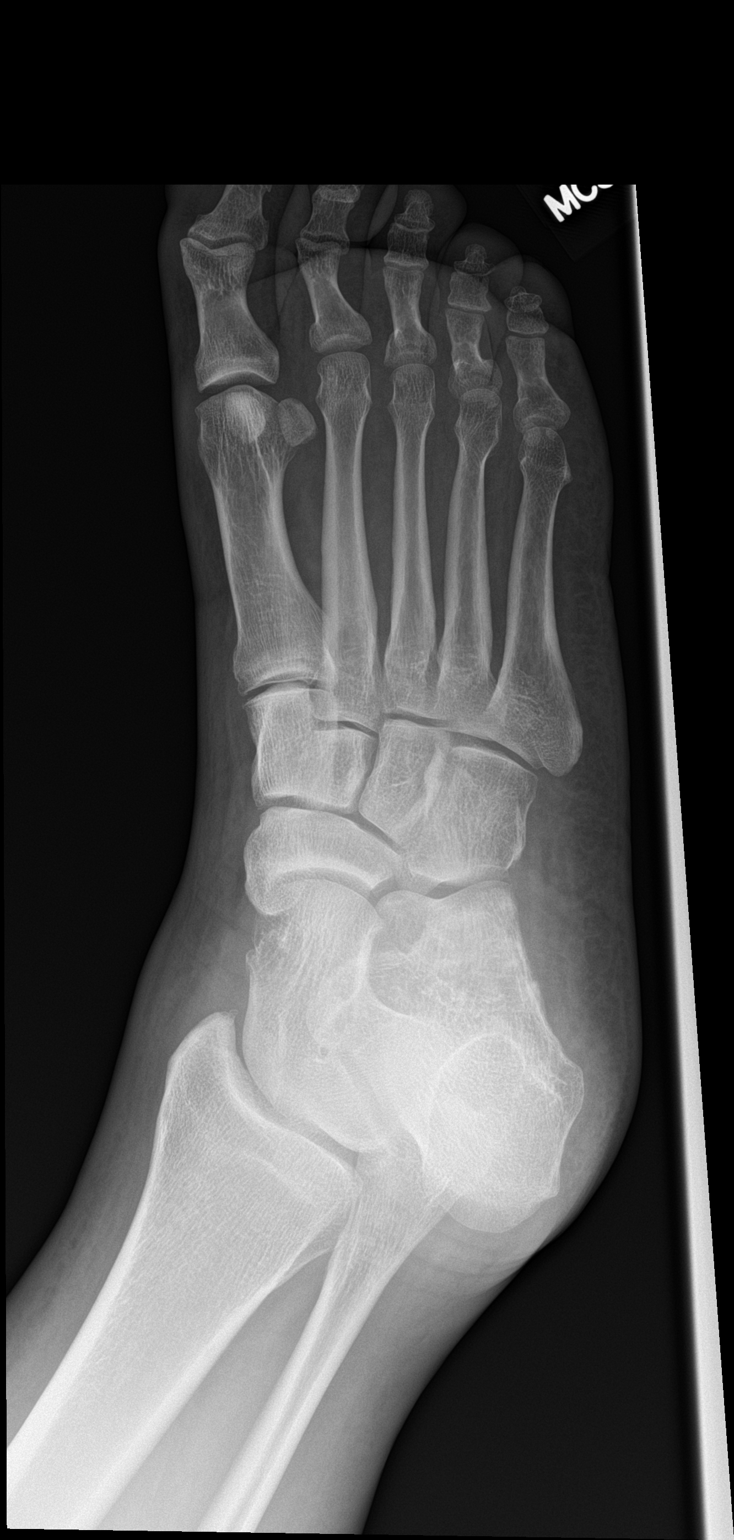

[foot lat]
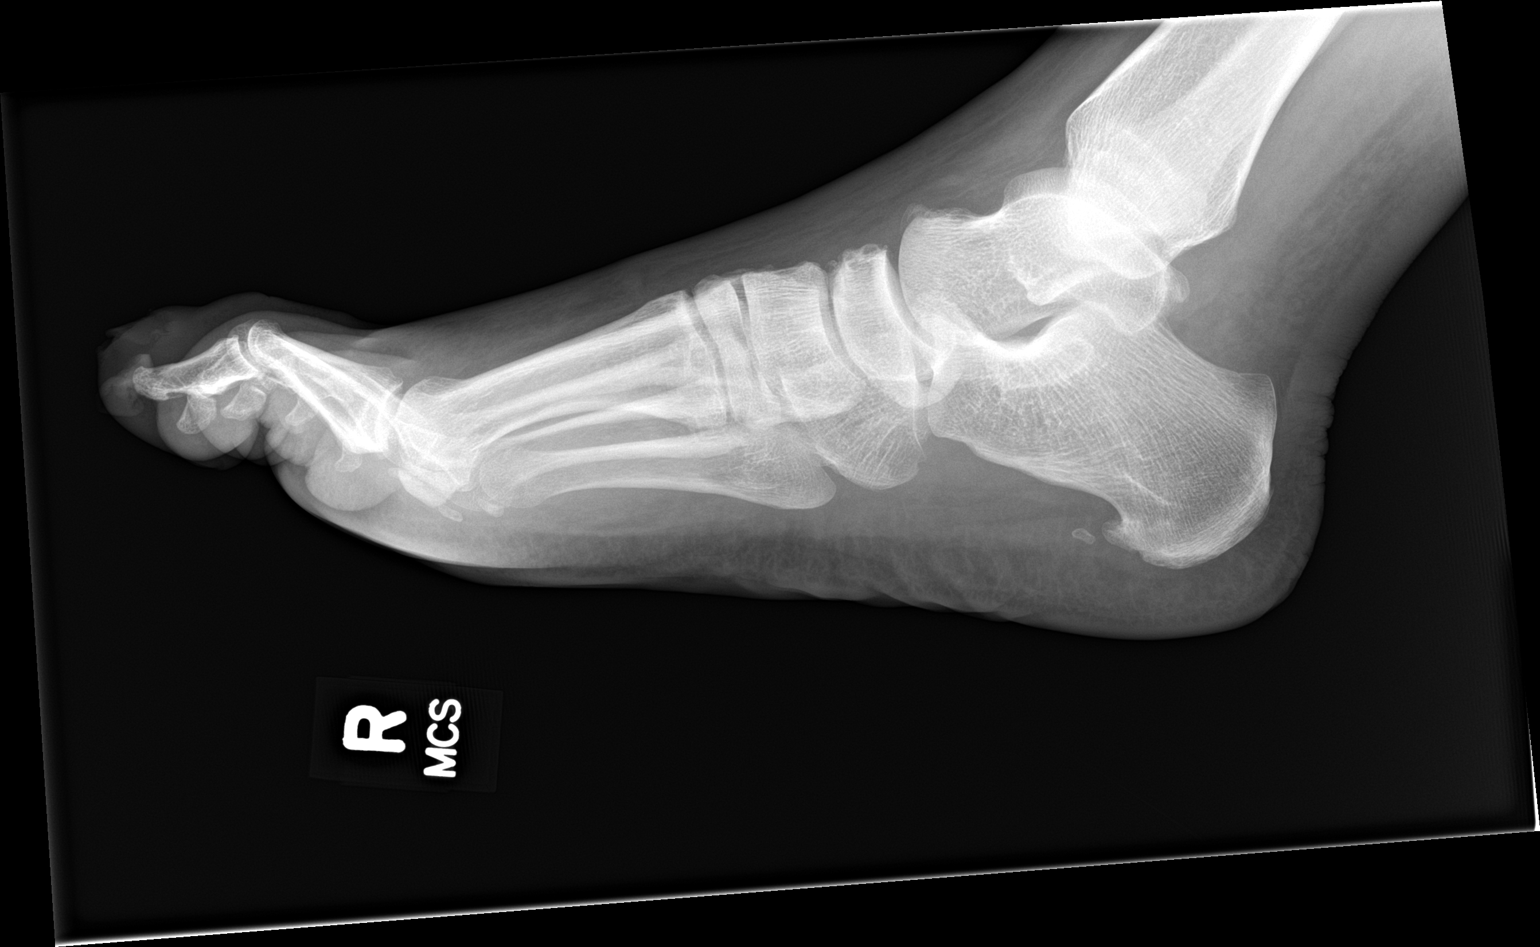

[3 of 3 positions shown; findings below may reference images not displayed]

FINDINGS: There is no evidence of fracture or dislocation. There is no
evidence of arthropathy or other focal bone abnormality. Soft
tissues are unremarkable. No bone destruction to suggest
osteomyelitis.
IMPRESSION: No acute bony abnormality.

## 2022-06-02 IMAGING — DX DG FOOT COMPLETE 3+V*L*
3 series · 3 of 3 positions shown · non-contrast
Comparison: None.

CLINICAL DATA: Diabetic foot infection.

EXAM:
LEFT FOOT - COMPLETE 3+ VIEW

[foot ap]
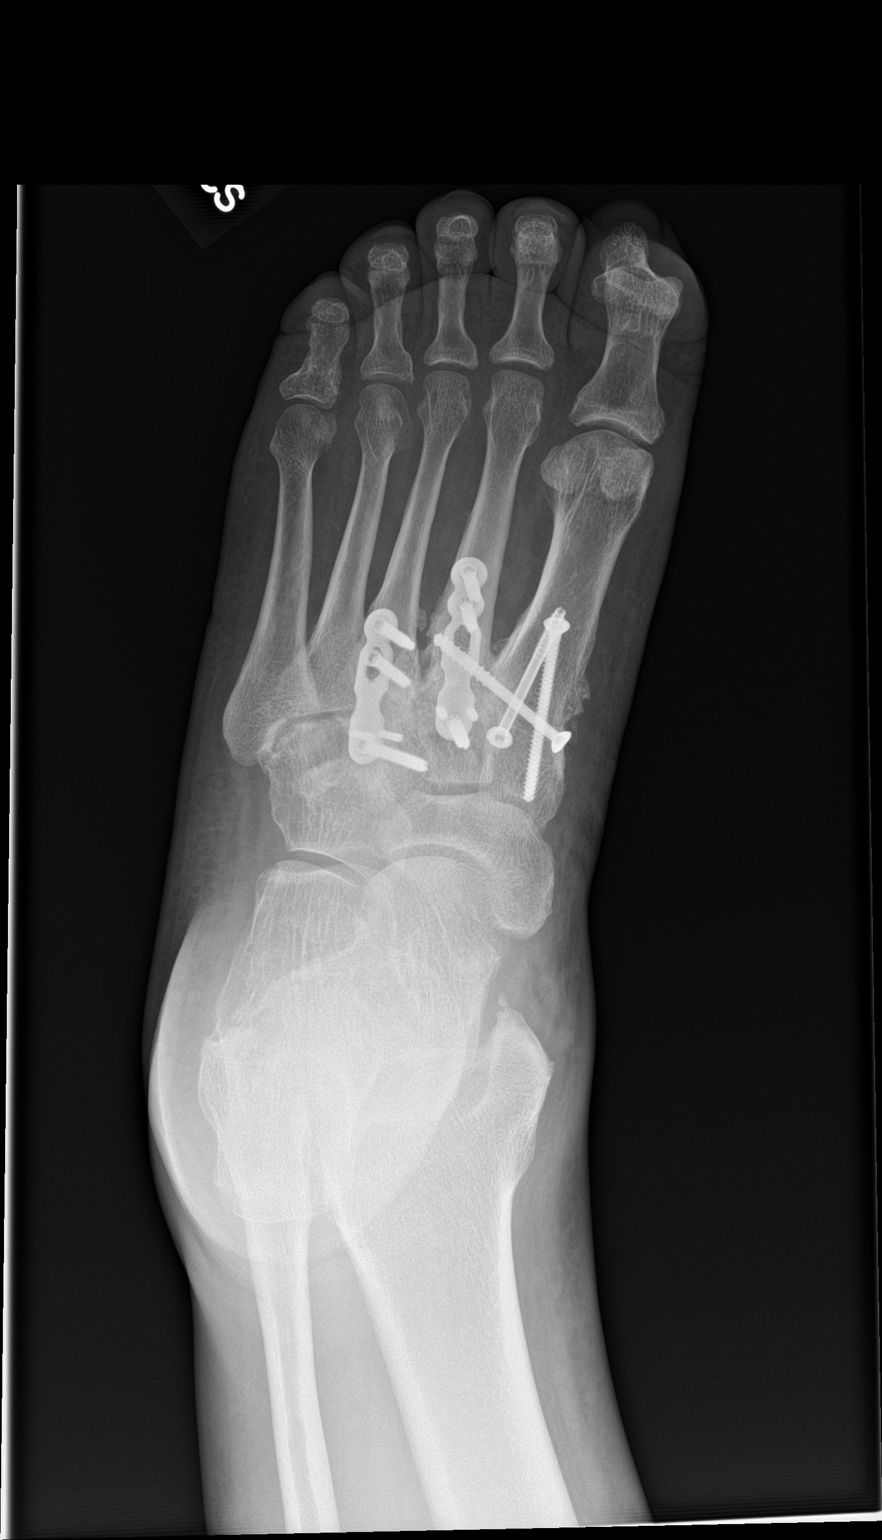

[foot obl]
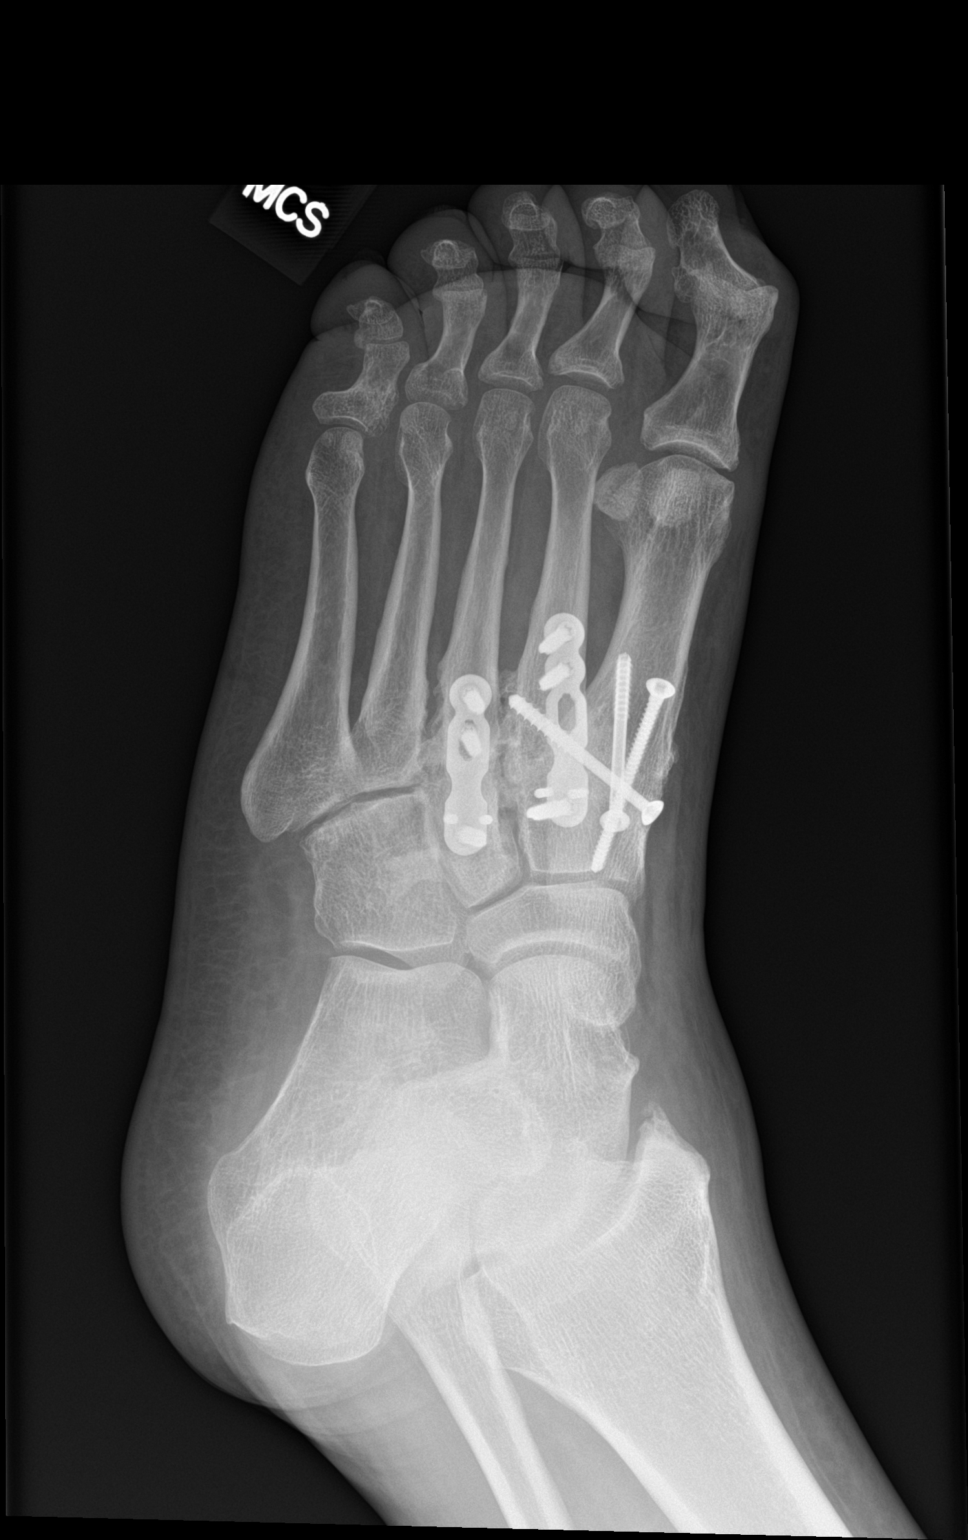

[foot lat]
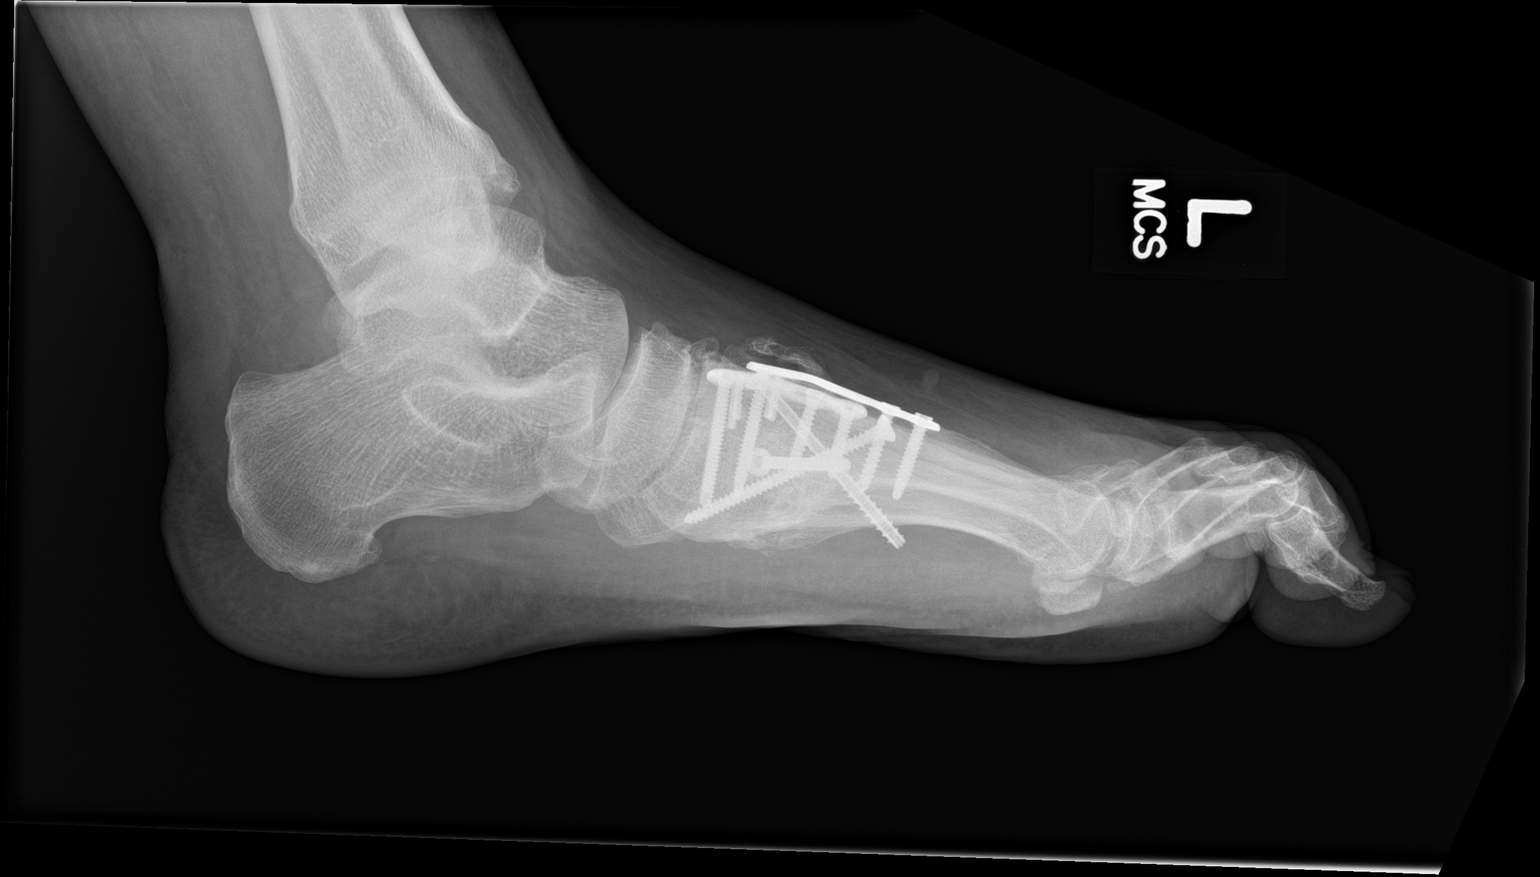

[3 of 3 positions shown; findings below may reference images not displayed]

FINDINGS: Hardware noted in the 1st through 3rd rays across the tarsal
metatarsal joints. No acute bony abnormality. Specifically, no
fracture, subluxation, or dislocation. No bone destruction to
suggest osteomyelitis.
IMPRESSION: No acute bony abnormality.

## 2022-06-02 MED ORDER — DOXYCYCLINE HYCLATE 100 MG PO TABS: 100.0000 mg | ORAL_TABLET | Freq: Two times a day (BID) | ORAL | 1 refills | Status: DC

## 2022-06-02 NOTE — Telephone Encounter (Signed)
Pt called to inform us the RX Doxycyline that was sent to him he is allergic to. He said any other rx will work but that one.

## 2022-06-02 NOTE — Telephone Encounter (Signed)
Patient called  he has a infection in his 2nd toe again , he would like an antibiotic called into his pharmacy ,please advise .

## 2022-06-02 NOTE — Telephone Encounter (Signed)
Sent in prescription 

## 2022-06-05 ENCOUNTER — Other Ambulatory Visit: Payer: Self-pay | Admitting: Podiatry

## 2022-06-05 MED ORDER — SULFAMETHOXAZOLE-TRIMETHOPRIM 800-160 MG PO TABS: 1.0000 | ORAL_TABLET | Freq: Two times a day (BID) | ORAL | 1 refills | Status: DC

## 2022-06-05 NOTE — Progress Notes (Unsigned)
e

## 2022-06-05 NOTE — Telephone Encounter (Signed)
done

## 2022-06-16 ENCOUNTER — Ambulatory Visit (INDEPENDENT_AMBULATORY_CARE_PROVIDER_SITE_OTHER): Payer: Commercial Managed Care - PPO

## 2022-06-16 ENCOUNTER — Ambulatory Visit (INDEPENDENT_AMBULATORY_CARE_PROVIDER_SITE_OTHER): Payer: Commercial Managed Care - PPO | Admitting: Podiatry

## 2022-06-16 ENCOUNTER — Encounter: Payer: Self-pay | Admitting: Podiatry

## 2022-06-16 DIAGNOSIS — L97511 Non-pressure chronic ulcer of other part of right foot limited to breakdown of skin: Secondary | ICD-10-CM | POA: Diagnosis not present

## 2022-06-19 NOTE — Progress Notes (Signed)
Subjective:   Patient ID: Justin Watkins, male   DOB: 46 y.o.   MRN: [id number]   HPI Patient states the right second toe continues to give him some problems and states that he has been on antibiotics and it looks better than it did but he just wants it checked   ROS      Objective:  Physical Exam  Neurovascular status unchanged muscle strength found to be adequate with distal keratotic tissue with slight bit of distal opening of the right second digit localized with no proximal edema erythema drainage noted and upon questioning no systemic signs of infection noted currently     Assessment:  Probability for low-grade distal ulceration second digit right possibility for bone infection right that is related also to a long second toe     Plan:  H&P reviewed conditions discussed his diabetes which is under reasonably good control and the importance of doing this and I went ahead today and I am placing him in a wedge shoe to keep all pressure off the toe is much as possible I debrided tissue I flushed and applied a small amount Iodosorb is sterile dressing and continue wet-to-dry dressings at home.  Discussed the possibility for distal amputation if digit remains discolored but at this point we will continue this conservative treatment and hopefully will heal over uneventfully as other lesions have in the past.  Patient encouraged to come in immediately if any changes as far as redness swelling or any systemic signs of infection were to occur and is encouraged to call with questions concerns will be seen back to reevaluate in the next few weeks  X-rays indicate that there could be some slight resorption of the distal portion of the distal phalanx right second digit but the middle and proximal phalanx look very healthy

## 2023-03-15 ENCOUNTER — Encounter: Payer: Self-pay | Admitting: *Deleted

## 2023-05-23 ENCOUNTER — Emergency Department (HOSPITAL_BASED_OUTPATIENT_CLINIC_OR_DEPARTMENT_OTHER): Payer: Commercial Managed Care - PPO

## 2023-05-23 ENCOUNTER — Encounter (HOSPITAL_BASED_OUTPATIENT_CLINIC_OR_DEPARTMENT_OTHER): Payer: Self-pay

## 2023-05-23 ENCOUNTER — Other Ambulatory Visit: Payer: Self-pay

## 2023-05-23 ENCOUNTER — Emergency Department (HOSPITAL_BASED_OUTPATIENT_CLINIC_OR_DEPARTMENT_OTHER)
Admission: EM | Admit: 2023-05-23 | Discharge: 2023-05-23 | Disposition: A | Discharge status: Home / Self Care | Payer: Commercial Managed Care - PPO | Attending: Emergency Medicine | Admitting: Emergency Medicine

## 2023-05-23 DIAGNOSIS — E11628 Type 2 diabetes mellitus with other skin complications: Secondary | ICD-10-CM | POA: Diagnosis not present

## 2023-05-23 DIAGNOSIS — L089 Local infection of the skin and subcutaneous tissue, unspecified: Secondary | ICD-10-CM | POA: Diagnosis not present

## 2023-05-23 DIAGNOSIS — Z5329 Procedure and treatment not carried out because of patient's decision for other reasons: Secondary | ICD-10-CM | POA: Diagnosis not present

## 2023-05-23 DIAGNOSIS — M79671 Pain in right foot: Secondary | ICD-10-CM | POA: Diagnosis present

## 2023-05-23 LAB — CBC WITH DIFFERENTIAL/PLATELET
Abs Immature Granulocytes: 0.02 10*3/uL (ref 0.00–0.07)
Basophils Absolute: 0 10*3/uL (ref 0.0–0.1)
Basophils Relative: 0 %
Eosinophils Absolute: 0 10*3/uL (ref 0.0–0.5)
Eosinophils Relative: 0 %
HCT: 34.1 % — ABNORMAL LOW (ref 39.0–52.0)
Hemoglobin: 11.5 g/dL — ABNORMAL LOW (ref 13.0–17.0)
Immature Granulocytes: 0 %
Lymphocytes Relative: 14 %
Lymphs Abs: 1 10*3/uL (ref 0.7–4.0)
MCH: 30.4 pg (ref 26.0–34.0)
MCHC: 33.7 g/dL (ref 30.0–36.0)
MCV: 90.2 fL (ref 80.0–100.0)
Monocytes Absolute: 0.7 10*3/uL (ref 0.1–1.0)
Monocytes Relative: 9 %
Neutro Abs: 5.7 10*3/uL (ref 1.7–7.7)
Neutrophils Relative %: 77 %
Platelets: 127 10*3/uL — ABNORMAL LOW (ref 150–400)
RBC: 3.78 MIL/uL — ABNORMAL LOW (ref 4.22–5.81)
RDW: 12.6 % (ref 11.5–15.5)
WBC: 7.5 10*3/uL (ref 4.0–10.5)
nRBC: 0 % (ref 0.0–0.2)

## 2023-05-23 LAB — COMPREHENSIVE METABOLIC PANEL
ALT: 18 U/L (ref 0–44)
AST: 17 U/L (ref 15–41)
Albumin: 4.2 g/dL (ref 3.5–5.0)
Alkaline Phosphatase: 72 U/L (ref 38–126)
Anion gap: 7 (ref 5–15)
BUN: 28 mg/dL — ABNORMAL HIGH (ref 6–20)
CO2: 26 mmol/L (ref 22–32)
Calcium: 9.4 mg/dL (ref 8.9–10.3)
Chloride: 105 mmol/L (ref 98–111)
Creatinine, Ser: 2.35 mg/dL — ABNORMAL HIGH (ref 0.61–1.24)
GFR, Estimated: 33 mL/min — ABNORMAL LOW (ref >60)
Glucose, Bld: 203 mg/dL — ABNORMAL HIGH (ref 70–99)
Potassium: 4.5 mmol/L (ref 3.5–5.1)
Sodium: 138 mmol/L (ref 135–145)
Total Bilirubin: 1.1 mg/dL (ref 0.3–1.2)
Total Protein: 6.5 g/dL (ref 6.5–8.1)

## 2023-05-23 LAB — LACTIC ACID, PLASMA: Lactic Acid, Venous: 0.9 mmol/L (ref 0.5–1.9)

## 2023-05-23 MED ORDER — DOXYCYCLINE HYCLATE 100 MG PO CAPS: 100.0000 mg | ORAL_CAPSULE | Freq: Two times a day (BID) | ORAL | 0 refills | Status: DC

## 2023-05-23 MED ORDER — METRONIDAZOLE 500 MG/100ML IV SOLN
500.0000 mg | Freq: Two times a day (BID) | INTRAVENOUS | Status: DC
  Administered 2023-05-23: 500 mg via INTRAVENOUS
  Filled 2023-05-23: qty 100

## 2023-05-23 MED ORDER — VANCOMYCIN HCL IN DEXTROSE 1-5 GM/200ML-% IV SOLN
1000.0000 mg | Freq: Once | INTRAVENOUS | Status: AC
  Administered 2023-05-23: 1000 mg via INTRAVENOUS
  Filled 2023-05-23: qty 200

## 2023-05-23 MED ORDER — VANCOMYCIN HCL 1250 MG/250ML IV SOLN
1250.0000 mg | INTRAVENOUS | Status: DC
  Filled 2023-05-23: qty 250

## 2023-05-23 MED ORDER — SODIUM CHLORIDE 0.9 % IV BOLUS
1000.0000 mL | Freq: Once | INTRAVENOUS | Status: AC
  Administered 2023-05-23: 1000 mL via INTRAVENOUS

## 2023-05-23 MED ORDER — FLUCONAZOLE 200 MG PO TABS: 200.0000 mg | ORAL_TABLET | Freq: Once | ORAL | 0 refills | Status: AC

## 2023-05-23 MED ORDER — CLINDAMYCIN HCL 150 MG PO CAPS: 450.0000 mg | ORAL_CAPSULE | Freq: Three times a day (TID) | ORAL | 0 refills | Status: AC

## 2023-05-23 MED ORDER — SODIUM CHLORIDE 0.9 % IV SOLN
INTRAVENOUS | Status: DC
Start: 2023-05-23 — End: 2023-05-23

## 2023-05-23 MED ORDER — SODIUM CHLORIDE 0.9 % IV SOLN
2.0000 g | INTRAVENOUS | Status: DC
  Administered 2023-05-23: 2 g via INTRAVENOUS
  Filled 2023-05-23: qty 20

## 2023-05-23 NOTE — ED Triage Notes (Signed)
Pt to er, pt states that he has dm and neuropathy, states that about three weeks ago he was walking and had a blister on his toe, states that it cracked open and he is here today because now he has redness and swelling in his leg and it feels like there is a mass in his groin.

## 2023-05-23 NOTE — ED Notes (Signed)
Pt in bed, pt has wound to his R big toe, area is cracked, no drainage noted, pt has swelling and warmth to his R lower leg with some streaking going up his leg, pt reports a mass in his groin.  Pt ambulatory with a limp.

## 2023-05-23 NOTE — Discharge Instructions (Signed)
Please return if symptoms worsen or if you change your mind about being treated with IV antibiotics.  Try to follow-up with your podiatrist tomorrow primary care doctor tomorrow for reevaluation as well.

## 2023-05-23 NOTE — ED Provider Notes (Addendum)
[facility] [facility] Provider Note   CSN: [id number] Arrival date & time: 05/23/23  6578     History  Chief Complaint  Patient presents with   Foot Pain    Justin Watkins is a 47 y.o. male.  Patient here with concern for her right foot infection.  History of diabetes with infections in the past.  He had a wound on his big toe after he went on a long walk.  Now he has increased redness and swelling to the right foot right lower leg with streaking of redness up into his right groin with some adenopathy in the upper groin.  He has had some chills but not sure if he had any fevers.  Blood sugars been up and down.  Denies any weakness or numbness or tingling otherwise.  Has chronic neuropathy in his feet.  The history is provided by the patient.       Home Medications Prior to Admission medications   Medication Sig Start Date End Date Taking? Authorizing Provider  B-D UF III MINI PEN NEEDLES 31G X 5 MM MISC 1 each by Other route 4 (four) times daily. 03/20/20  Yes [provider]  carvedilol (COREG) 25 MG tablet Take 25 mg by mouth 2 (two) times daily. 05/06/23  Yes [provider]  clindamycin (CLEOCIN) 150 MG capsule Take 3 capsules (450 mg total) by mouth 3 (three) times daily for 10 days. 05/23/23 06/02/23 Yes Lucita Montoya, DO  famotidine (PEPCID) 20 MG tablet Take 20 mg by mouth as needed for heartburn or indigestion. 10/31/22  Yes [provider]  fluconazole (DIFLUCAN) 200 MG tablet Take 1 tablet (200 mg total) by mouth once for 1 dose. 05/23/23 05/23/23 Yes Ambriana Selway, DO  furosemide (LASIX) 20 MG tablet Take 40 mg by mouth daily. 05/04/20  Yes [provider]  HUMALOG KWIKPEN 100 UNIT/ML KwikPen Inject 10-20 Units into the skin 2 (two) times daily before a meal. Based on sliding scale 05/04/20  Yes [provider]  losartan (COZAAR) 100 MG tablet Take 100 mg by mouth daily. 03/08/20  Yes [provider]  Menthol-Zinc Oxide (GOLD BOND EXTRA STRENGTH) POWD Apply 1 Application topically as needed. 04/03/20  Yes [provider]  ONETOUCH ULTRA test strip 1 each by Other route 3 (three) times daily. 04/24/20  Yes [provider]  pentoxifylline (TRENTAL) 400 MG CR tablet Take 400 mg by mouth 2 (two) times daily with a meal.   Yes [provider]  pioglitazone (ACTOS) 15 MG tablet Take 15 mg by mouth every evening. 02/26/20  Yes [provider]  sildenafil (VIAGRA) 100 MG tablet Take 100 mg by mouth as needed for erectile dysfunction (take 1 hour prior to intercourse). 09/18/16  Yes [provider]  testosterone cypionate (DEPOTESTOSTERONE CYPIONATE) 200 MG/ML injection Inject 200 mg into the muscle every 14 (fourteen) days. 02/04/20  Yes [provider]  TOUJEO SOLOSTAR 300 UNIT/ML Solostar Pen Inject 60 Units into the skin at bedtime. 05/22/23  Yes [provider]  Vitamin D, Ergocalciferol, (DRISDOL) 1.25 MG (50000 UNIT) CAPS capsule Take 50,000 Units by mouth once a week. Thursdays 03/17/20  Yes [provider]  rosuvastatin (CRESTOR) 10 MG tablet Take 10 mg by mouth daily. Patient not taking: Reported on 05/23/2023 03/05/20   [provider]      Allergies    Amoxicillin and Doxycycline    Review of Systems   Review of Systems  Physical Exam Updated Vital Signs BP (!) 184/104 (BP Location: Right Arm)   Pulse 99   Temp 98.1 F (36.7 C) (Oral)   Resp 17   Ht 6\' 1"  (1.854 m)   Wt 136.1 kg   SpO2 96%   BMI 39.58 kg/m  Physical Exam Vitals and nursing note reviewed.  Constitutional:      General: He is not in acute distress.    Appearance: He is well-developed. He is not ill-appearing.  HENT:     Head: Normocephalic and atraumatic.     Nose: Nose normal.     Mouth/Throat:     Mouth: Mucous membranes are moist.  Eyes:     Extraocular Movements: Extraocular movements intact.      Conjunctiva/sclera: Conjunctivae normal.     Pupils: Pupils are equal, round, and reactive to light.  Cardiovascular:     Rate and Rhythm: Normal rate and regular rhythm.     Pulses: Normal pulses.     Heart sounds: No murmur heard. Pulmonary:     Effort: Pulmonary effort is normal. No respiratory distress.     Breath sounds: Normal breath sounds.  Abdominal:     Palpations: Abdomen is soft.     Tenderness: There is no abdominal tenderness.  Musculoskeletal:        General: Swelling present.     Cervical back: Normal range of motion and neck supple.  Skin:    General: Skin is warm and dry.     Capillary Refill: Capillary refill takes less than 2 seconds.     Findings: Erythema present.     Comments: He is got swelling to the right lower extremity when compared to the left, there is swelling around the right foot with multiple areas of skin breakdown to the toes of the right foot, he is got streaking of erythema up along the inside of the right leg all the way up into the groin with a some adenopathy  Neurological:     General: No focal deficit present.     Mental Status: He is alert.  Psychiatric:        Mood and Affect: Mood normal.     ED Results / Procedures / Treatments   Labs (all labs ordered are listed, but only abnormal results are displayed) Labs Reviewed  COMPREHENSIVE METABOLIC PANEL - Abnormal; Notable for the following components:      Result Value   Glucose, Bld 203 (*)    BUN 28 (*)    Creatinine, Ser 2.35 (*)    GFR, Estimated 33 (*)    All other components within normal limits  CBC WITH DIFFERENTIAL/PLATELET - Abnormal; Notable for the following components:   RBC 3.78 (*)    Hemoglobin 11.5 (*)    HCT 34.1 (*)    Platelets 127 (*)    All other components within normal limits  CULTURE, BLOOD (ROUTINE X 2)  CULTURE, BLOOD (ROUTINE X 2)  LACTIC ACID, PLASMA    EKG None  Radiology DG Foot Complete Right  Result Date: 05/23/2023 CLINICAL DATA:   Infection of the great toe of the right. History of diabetes. EXAM: RIGHT FOOT COMPLETE - 3+ VIEW COMPARISON:  None Available. FINDINGS: There is no evidence of fracture or dislocation. There is no cortical erosion or periosteal reaction to suggest osteomyelitis. Soft tissues swelling about the first digit and dorsum of the foot. IMPRESSION: Soft tissue swelling about the first digit and dorsum of the foot. No radiographic evidence of osteomyelitis.  Electronically Signed   By: Larose Hires D.O.   On: 05/23/2023 10:34    Procedures Procedures    Medications Ordered in ED Medications  sodium chloride 0.9 % bolus 1,000 mL (0 mLs Intravenous Stopped 05/23/23 1138)    And  0.9 %  sodium chloride infusion ( Intravenous New Bag/Given 05/23/23 1140)  cefTRIAXone (ROCEPHIN) 2 g in sodium chloride 0.9 % 100 mL IVPB (0 g Intravenous Stopped 05/23/23 1106)  metroNIDAZOLE (FLAGYL) IVPB 500 mg (0 mg Intravenous Stopped 05/23/23 1138)  vancomycin (VANCOCIN) IVPB 1000 mg/200 mL premix (0 mg Intravenous Stopped 05/23/23 1141)    And  vancomycin (VANCOCIN) IVPB 1000 mg/200 mL premix (1,000 mg Intravenous New Bag/Given 05/23/23 1144)  vancomycin (VANCOREADY) IVPB 1250 mg/250 mL (has no administration in time range)    ED Course/ Medical Decision Making/ A&P                             Medical Decision Making Amount and/or Complexity of Data Reviewed Labs: ordered. Radiology: ordered.  Risk Prescription drug management. Decision regarding hospitalization.   Quientin K Boeve is here with right foot swelling and concern for diabetic foot infection.  He is hypertensive otherwise normal vitals.  He has had no fevers but has had chills at home.  Maybe low-grade fever.  Ultimately clinically looks like he has a bad diabetic foot infection that is now spread to the upper portion of the leg.  He is got redness and swelling that looks greater on the right than the left.  There is some element of venous stasis but he  also has some red streaking up into the right thigh region as well.  There is no crepitus.  Seems like he had a blister underneath the right toe few weeks ago and now has progressed.  I do think he will need IV antibiotics and at least observation stay to help fight back this wound.  He has good pulses on exam.  Will check CBC BMP, blood cultures, lactic acid and give IV antibiotics and admit for further care.  Right now he does not really meet sepsis criteria.  Per radiology report no signs of osteomyelitis on x-ray.  There is significant swelling to the foot otherwise.  No significant leukocytosis or lactic acidosis per my review and interpretation of labs.  Does have little bit elevation in his creatinine from his baseline.  Otherwise electrolytes are unremarkable.  Overall I do think he benefit from observation stay for IV antibiotics to be more aggressive with cellulitis as ongoing.  Will admit to medicine for further care.  Plan was to admit but then patient due to some personal issues at home decided to leave AGAINST MEDICAL ADVICE.  He is unable to find care for his child at home.  Will put him on oral antibiotics.  I strongly encouraged him to try to come back for IV antibiotics or at least have a good follow-up with his primary care doctor podiatrist tomorrow.  This chart was dictated using voice recognition software.  Despite best efforts to proofread,  errors can occur which can change the documentation meaning.         Final Clinical Impression(s) / ED Diagnoses Final diagnoses:  Diabetic foot infection (HCC)    Rx / DC Orders ED Discharge Orders          Ordered    doxycycline (VIBRAMYCIN) 100 MG capsule  2 times daily,  Status:  Discontinued        05/23/23 1115    fluconazole (DIFLUCAN) 200 MG tablet   Once        05/23/23 1156    clindamycin (CLEOCIN) 150 MG capsule  3 times daily        05/23/23 1206              Virgina Norfolk, DO 05/23/23 1109     Johna Kearl, DO 05/23/23 1116    Hether Anselmo, DO 05/23/23 1206

## 2023-05-23 NOTE — Progress Notes (Signed)
Pharmacy Antibiotic Note  Justin Watkins is a 47 y.o. male admitted on 05/23/2023 with  foot infection .  Pharmacy has been consulted for vancomycin dosing. Pt is afebrile and WBC is WNL. Lactic acid is <2. Scr is elevated above baseline at 2.35.   Plan: Vanc 2g IV x 1 then 1250mg  IV Q24H  F/u renal fxn, C&S, clinical status and peak/trough at SS  Height: 6\' 1"  (185.4 cm) Weight: 136.1 kg (300 lb) IBW/kg (Calculated) : 79.9  Temp (24hrs), Avg:98.1 F (36.7 C), Min:98.1 F (36.7 C), Max:98.1 F (36.7 C)  Recent Labs  Lab 05/23/23 1010  WBC 7.5  CREATININE 2.35*  LATICACIDVEN 0.9    Estimated Creatinine Clearance: 55.1 mL/min (A) (by C-G formula based on SCr of 2.35 mg/dL (H)).    Allergies  Allergen Reactions   Amoxicillin Diarrhea   Doxycycline Diarrhea    Antimicrobials this admission: Vanc 6/26>> CTX 6/26>> Flagyl 6/26>>  Dose adjustments this admission: N/A  Microbiology results: Pending  Thank you for allowing pharmacy to be a part of this patient's care.  Cristino Degroff, Drake Leach 05/23/2023 10:10 AM

## 2023-05-24 LAB — CULTURE, BLOOD (ROUTINE X 2): Culture: NO GROWTH

## 2023-05-25 ENCOUNTER — Encounter: Payer: Self-pay | Admitting: Podiatry

## 2023-05-25 ENCOUNTER — Ambulatory Visit (INDEPENDENT_AMBULATORY_CARE_PROVIDER_SITE_OTHER): Payer: Commercial Managed Care - PPO | Admitting: Podiatry

## 2023-05-25 DIAGNOSIS — L97511 Non-pressure chronic ulcer of other part of right foot limited to breakdown of skin: Secondary | ICD-10-CM

## 2023-05-25 LAB — CULTURE, BLOOD (ROUTINE X 2)

## 2023-05-25 NOTE — Progress Notes (Signed)
Subjective:   Patient ID: Justin Watkins, male   DOB: 47 y.o.   MRN: [id number]   HPI Patient was seen at hospital for an infection of his right big toe and admits he had a blister 2 weeks ago.  Has had a history of this in the past but this is the first time in over a year and he is currently on doxycycline 3 times a day and his blood sugar has been running well at 6.5   ROS      Objective:  Physical Exam  Neuro vascular status found to be intact muscle strength was found to be adequate with patient found to have swelling of the right big toe he has swelling in his leg but upon questioning states that normal for him with history of lymphedema and has discoloration and breakdown of tissue distal no active drainage     Assessment:  Significant trauma to the right hallux toe with ulcer infection which appears to be right now under control     Plan:  H&P reviewed previous x-ray debrided tissue flushed the area applied dressing sterile and discussed continuing his doxycycline what to do if any redness systemic signs of infection were to occur with him to go straight to the emergency room and I do want to rex-ray a week from Monday as I am concerned about the appearance of the bone structure.  Do not think there is osteomyelitis but it does appear to have changed but I want her rex-rayed again in 10 days to make determination

## 2023-05-26 LAB — CULTURE, BLOOD (ROUTINE X 2): Culture: NO GROWTH

## 2023-05-27 LAB — CULTURE, BLOOD (ROUTINE X 2)
Special Requests: ADEQUATE
Special Requests: ADEQUATE

## 2023-06-04 ENCOUNTER — Ambulatory Visit (INDEPENDENT_AMBULATORY_CARE_PROVIDER_SITE_OTHER): Payer: 59 | Admitting: Podiatry

## 2023-06-04 ENCOUNTER — Encounter: Payer: Self-pay | Admitting: Podiatry

## 2023-06-04 ENCOUNTER — Other Ambulatory Visit: Payer: Self-pay | Admitting: Podiatry

## 2023-06-04 ENCOUNTER — Ambulatory Visit (INDEPENDENT_AMBULATORY_CARE_PROVIDER_SITE_OTHER): Payer: Commercial Managed Care - PPO

## 2023-06-04 DIAGNOSIS — L97511 Non-pressure chronic ulcer of other part of right foot limited to breakdown of skin: Secondary | ICD-10-CM

## 2023-06-04 DIAGNOSIS — L03032 Cellulitis of left toe: Secondary | ICD-10-CM

## 2023-06-04 MED ORDER — CLINDAMYCIN HCL 300 MG PO CAPS: 300.0000 mg | ORAL_CAPSULE | Freq: Three times a day (TID) | ORAL | 1 refills | Status: AC

## 2023-06-05 NOTE — Progress Notes (Signed)
Subjective:   Patient ID: Masaki K Geister, male   DOB: 47 y.o.   MRN: [id number]   HPI Patient states he seems to be improving right still soaking and states his sugar has been running well and has had no fever or systemic signs of infection   ROS      Objective:  Physical Exam  Neurovascular status intact with area of breakdown distal portion right hallux that is healing and crusting over measuring approximately 1 cm x 1 cm localized no subcutaneous exposure currently no proximal edema erythema or drainage noted.  Proximal to the area of breakdown there is a abscess of the localized nature noted.  It is not connected to anything and does not appear to have any systemic processes     Assessment:  Overall doing well from ulceration of the right big toe with small abscess of the right hallux proximal     Plan:  H&P precautionary x-ray taken.  Anesthetized the right hallux sterile drain of the abscess it was localized and I did not note any extension from it no subcutaneous exposure or other pathology.  I applied sterile dressing instructed on soaks and bandage usage wet-to-dry and gave strict instructions if any erythema edema or other changes were to occur or systemic signs of infection were to occur he is to contact us immediately and does understand he still runs high risk of amputation of the hallux depending on how this does.  Patient to be reevaluated again in 2 weeks and should at that time to have another x-ray done to rule out osteolysis or osteomyelitic changes  X-rays overall look good there could be slight lysis but I do not see anything at this point penetrating and I do not think there is anything significant currently on x-ray.

## 2023-06-20 ENCOUNTER — Encounter: Payer: Self-pay | Admitting: Podiatry

## 2023-06-20 ENCOUNTER — Ambulatory Visit (INDEPENDENT_AMBULATORY_CARE_PROVIDER_SITE_OTHER): Payer: 59 | Admitting: Podiatry

## 2023-06-20 VITALS — BP 176/104 | HR 88 | Ht 73.0 in

## 2023-06-20 DIAGNOSIS — E0843 Diabetes mellitus due to underlying condition with diabetic autonomic (poly)neuropathy: Secondary | ICD-10-CM | POA: Diagnosis not present

## 2023-06-20 DIAGNOSIS — E119 Type 2 diabetes mellitus without complications: Secondary | ICD-10-CM

## 2023-06-20 DIAGNOSIS — L97512 Non-pressure chronic ulcer of other part of right foot with fat layer exposed: Secondary | ICD-10-CM | POA: Diagnosis not present

## 2023-06-20 NOTE — Progress Notes (Signed)
   No chief complaint on file.   Subjective:  47 y.o. male with PMHx of diabetes mellitus presenting today for evaluation of an ulcer to the distal end of the right great toe.  Patient has been seen by other physicians here in our practice.  He has been dressing the wound at home.   Past Medical History:  Diagnosis Date   Diabetes mellitus without complication (HCC)    Hypertension    TIA (transient ischemic attack)     Past Surgical History:  Procedure Laterality Date   BRAIN SURGERY     FOOT SURGERY      Allergies  Allergen Reactions   Amoxicillin Diarrhea   Doxycycline Diarrhea         Objective/Physical Exam General: The patient is alert and oriented x3 in no acute distress.  Dermatology:  Wound #1 noted to the distal end of the right great toe measuring approximately 1.0 x 2.7 x 0.3 cm (LxWxD).   To the noted ulceration(s), there is no eschar. There is a moderate amount of slough, fibrin, and necrotic tissue noted. Granulation tissue and wound base is red. There is a minimal amount of serosanguineous drainage noted. There is no exposed bone muscle-tendon ligament or joint. There is no malodor. Periwound integrity is intact. Skin is warm, dry and supple bilateral lower extremities.  Vascular: Palpable pedal pulses bilaterally. No edema or erythema noted. Capillary refill within normal limits.  Neurological: Light touch and protective threshold diminished bilaterally.   Musculoskeletal Exam: No prior amputations  Radiographic exam RT foot 06/04/2023: There is some concern for fracture and irregularity to the distal tip of the distal phalanx of the right great toe concerning for possible osteomyelitis.  Assessment: 1.  Ulcer right great toe secondary to diabetes mellitus 2. diabetes mellitus w/ peripheral neuropathy   Plan of Care:  -Patient was evaluated.  Comprehensive diabetic foot exam performed today -Medically necessary excisional debridement including  subcutaneous tissue was performed using a tissue nipper and a chisel blade. Excisional debridement of all the necrotic nonviable tissue down to healthy bleeding viable tissue was performed with post-debridement measurements same as pre-. -The wound was cleansed and dry sterile dressing applied. -Clinically the foot does not appear to have any underlying cellulitis or acute infection.   -Will observe for now and plan for follow-up x-rays 1 month.  If there is continued irregularity to the distal tip of the toe or advanced erosion of the toe MRI warranted -Recommend postoperative shoe -Return to clinic 3 weeks follow-up x-rays   Felecia Shelling, DPM [facility]  Dr. Felecia Shelling, DPM    [address] [place], [place] [address]                Office [phone]  Fax [phone]

## 2023-06-25 ENCOUNTER — Other Ambulatory Visit (INDEPENDENT_AMBULATORY_CARE_PROVIDER_SITE_OTHER): Payer: 59 | Admitting: Podiatry

## 2023-06-25 ENCOUNTER — Other Ambulatory Visit: Payer: Self-pay | Admitting: Podiatry

## 2023-06-25 ENCOUNTER — Telehealth: Payer: Self-pay | Admitting: Podiatry

## 2023-06-25 DIAGNOSIS — L03032 Cellulitis of left toe: Secondary | ICD-10-CM

## 2023-06-25 DIAGNOSIS — L97511 Non-pressure chronic ulcer of other part of right foot limited to breakdown of skin: Secondary | ICD-10-CM

## 2023-06-25 MED ORDER — GENTAMICIN SULFATE 0.1 % EX CREA
TOPICAL_CREAM | CUTANEOUS | 0 refills | Status: DC
Start: 2023-06-25 — End: 2023-06-25

## 2023-06-25 MED ORDER — GENTAMICIN SULFATE 0.1 % EX CREA
1.0000 | TOPICAL_CREAM | Freq: Three times a day (TID) | CUTANEOUS | 0 refills | Status: DC
Start: 2023-06-25 — End: 2023-06-25

## 2023-06-25 MED ORDER — GENTAMICIN SULFATE 0.1 % EX CREA
TOPICAL_CREAM | CUTANEOUS | 0 refills | Status: AC
Start: 2023-06-25 — End: ?

## 2023-06-25 NOTE — Progress Notes (Signed)
1. Infection of nail bed of toe of left foot   2. Skin ulcer of right great toe, limited to breakdown of skin (HCC)    Meds ordered this encounter  Medications   gentamicin cream (GARAMYCIN) 0.1 %    Sig: Apply to affected toe once daily.    Dispense:  30 g    Refill:  0  Per Dr. Logan Bores verbal order.

## 2023-06-25 NOTE — Progress Notes (Signed)
1. Infection of nail bed of toe of left foot   2. Skin ulcer of right great toe, limited to breakdown of skin (HCC)    Meds ordered this encounter  Medications   DISCONTD: gentamicin cream (GARAMYCIN) 0.1 %    Sig: Apply 1 Application topically 3 (three) times daily.    Dispense:  15 g    Refill:  0   gentamicin cream (GARAMYCIN) 0.1 %    Sig: Apply to affected toe once daily    Dispense:  15 g    Refill:  0

## 2023-06-25 NOTE — Telephone Encounter (Signed)
Pt saw Dr. Logan Bores & stated that he was supposed to call in a cream for his toe; pharmacy has not received Rx. He uses the [facility] pharmacy in [place].

## 2023-07-11 ENCOUNTER — Ambulatory Visit (INDEPENDENT_AMBULATORY_CARE_PROVIDER_SITE_OTHER): Payer: 59 | Admitting: Podiatry

## 2023-07-11 ENCOUNTER — Encounter: Payer: Self-pay | Admitting: Podiatry

## 2023-07-11 DIAGNOSIS — L97511 Non-pressure chronic ulcer of other part of right foot limited to breakdown of skin: Secondary | ICD-10-CM

## 2023-07-12 NOTE — Progress Notes (Signed)
Subjective:   Patient ID: Justin Watkins, male   DOB: 47 y.o.   MRN: [id number]   HPI Patient states it seems to be quite a bit improved I have not noted any significant drainage over the last week   ROS      Objective:  Physical Exam  Neurovascular status intact with patient's right hallux showing continued improvement with crusted tissue formation and a very small area of breakdown but no active drainage noted     Assessment:  Ulceration toe which seems to be improving quite a bit     Plan:  Reviewed condition we will continue wet-to-dry dressings and cream usage and at this point patient is hopefully with this condition encouraged to call with any questions or any think should occur swelling is minimal no indications of pathology that way no proximal edema erythema drainage was noted
